# Patient Record
Sex: Male | Born: 1995 | Race: Black or African American | Hispanic: No | Marital: Single | Smoking: Never smoker
Health system: Southern US, Community
[De-identification: ages and names within clinical notes are randomized; demographics above are authoritative.]

---
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
## 2013-02-13 ENCOUNTER — Emergency Department (HOSPITAL_COMMUNITY): Payer: No Typology Code available for payment source

## 2013-02-13 ENCOUNTER — Emergency Department (HOSPITAL_COMMUNITY)
Admission: EM | Admit: 2013-02-13 | Discharge: 2013-02-13 | Disposition: A | Discharge status: Home / Self Care | Payer: No Typology Code available for payment source | Attending: Emergency Medicine | Admitting: Emergency Medicine

## 2013-02-13 ENCOUNTER — Encounter (HOSPITAL_COMMUNITY): Payer: Self-pay

## 2013-02-13 DIAGNOSIS — Y92410 Unspecified street and highway as the place of occurrence of the external cause: Secondary | ICD-10-CM | POA: Insufficient documentation

## 2013-02-13 DIAGNOSIS — S139XXA Sprain of joints and ligaments of unspecified parts of neck, initial encounter: Secondary | ICD-10-CM | POA: Insufficient documentation

## 2013-02-13 DIAGNOSIS — S161XXA Strain of muscle, fascia and tendon at neck level, initial encounter: Secondary | ICD-10-CM

## 2013-02-13 DIAGNOSIS — S7010XA Contusion of unspecified thigh, initial encounter: Secondary | ICD-10-CM | POA: Insufficient documentation

## 2013-02-13 DIAGNOSIS — S7011XA Contusion of right thigh, initial encounter: Secondary | ICD-10-CM

## 2013-02-13 DIAGNOSIS — Y9389 Activity, other specified: Secondary | ICD-10-CM | POA: Insufficient documentation
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
## 2013-02-13 MED ORDER — IBUPROFEN 800 MG PO TABS: 800.0000 mg | ORAL_TABLET | Freq: Four times a day (QID) | ORAL | Status: DC | PRN
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
## 2013-02-13 NOTE — ED Notes (Signed)
Report given to angela,, rn
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
## 2020-12-10 ENCOUNTER — Other Ambulatory Visit: Payer: Self-pay

## 2020-12-10 ENCOUNTER — Ambulatory Visit (HOSPITAL_COMMUNITY)
Admission: EM | Admit: 2020-12-10 | Discharge: 2020-12-11 | Disposition: A | Discharge status: Other Acute Inpatient Hospital | Payer: No Payment, Other | Attending: Nurse Practitioner | Admitting: Nurse Practitioner

## 2020-12-10 ENCOUNTER — Encounter (HOSPITAL_COMMUNITY): Payer: Self-pay

## 2020-12-10 DIAGNOSIS — Z880 Allergy status to penicillin: Secondary | ICD-10-CM | POA: Insufficient documentation

## 2020-12-10 DIAGNOSIS — Z20822 Contact with and (suspected) exposure to covid-19: Secondary | ICD-10-CM | POA: Insufficient documentation

## 2020-12-10 DIAGNOSIS — F1210 Cannabis abuse, uncomplicated: Secondary | ICD-10-CM | POA: Insufficient documentation

## 2020-12-10 DIAGNOSIS — F19959 Other psychoactive substance use, unspecified with psychoactive substance-induced psychotic disorder, unspecified: Secondary | ICD-10-CM | POA: Insufficient documentation
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
## 2020-12-11 ENCOUNTER — Encounter (HOSPITAL_COMMUNITY): Payer: Self-pay | Admitting: Emergency Medicine

## 2020-12-11 ENCOUNTER — Emergency Department (HOSPITAL_COMMUNITY)
Admission: EM | Admit: 2020-12-11 | Discharge: 2020-12-15 | Disposition: A | Discharge status: Home / Self Care | Attending: Emergency Medicine | Admitting: Emergency Medicine

## 2020-12-11 ENCOUNTER — Other Ambulatory Visit: Payer: Self-pay

## 2020-12-11 DIAGNOSIS — Z880 Allergy status to penicillin: Secondary | ICD-10-CM | POA: Diagnosis not present

## 2020-12-11 DIAGNOSIS — Z20822 Contact with and (suspected) exposure to covid-19: Secondary | ICD-10-CM | POA: Diagnosis not present

## 2020-12-11 DIAGNOSIS — F19959 Other psychoactive substance use, unspecified with psychoactive substance-induced psychotic disorder, unspecified: Secondary | ICD-10-CM | POA: Insufficient documentation

## 2020-12-11 DIAGNOSIS — F22 Delusional disorders: Secondary | ICD-10-CM | POA: Insufficient documentation

## 2020-12-11 DIAGNOSIS — F23 Brief psychotic disorder: Secondary | ICD-10-CM

## 2020-12-11 DIAGNOSIS — F1210 Cannabis abuse, uncomplicated: Secondary | ICD-10-CM | POA: Diagnosis not present

## 2020-12-11 LAB — SALICYLATE LEVEL: Salicylate Lvl: <7 mg/dL — ABNORMAL LOW (ref 7.0–30.0)

## 2020-12-11 LAB — CBC
HCT: 39.3 % (ref 39.0–52.0)
Hemoglobin: 13.5 g/dL (ref 13.0–17.0)
MCH: 31.1 pg (ref 26.0–34.0)
MCHC: 34.4 g/dL (ref 30.0–36.0)
MCV: 90.6 fL (ref 80.0–100.0)
Platelets: 292 10*3/uL (ref 150–400)
RBC: 4.34 MIL/uL (ref 4.22–5.81)
RDW: 12.1 % (ref 11.5–15.5)
WBC: 10.3 10*3/uL (ref 4.0–10.5)
nRBC: 0 % (ref 0.0–0.2)

## 2020-12-11 LAB — POCT URINE DRUG SCREEN - MANUAL ENTRY (I-CUP)
POC Buprenorphine (BUP): NOT DETECTED
POC Cocaine UR: NOT DETECTED
POC Marijuana UR: POSITIVE — AB
POC Morphine: NOT DETECTED
POC Oxycodone UR: NOT DETECTED
POC Secobarbital (BAR): NOT DETECTED

## 2020-12-11 LAB — COMPREHENSIVE METABOLIC PANEL
ALT: 26 U/L (ref 0–44)
AST: 66 U/L — ABNORMAL HIGH (ref 15–41)
Albumin: 4.5 g/dL (ref 3.5–5.0)
Alkaline Phosphatase: 51 U/L (ref 38–126)
Anion gap: 16 — ABNORMAL HIGH (ref 5–15)
BUN: 10 mg/dL (ref 6–20)
CO2: 19 mmol/L — ABNORMAL LOW (ref 22–32)
Calcium: 9.6 mg/dL (ref 8.9–10.3)
Chloride: 101 mmol/L (ref 98–111)
Creatinine, Ser: 1.34 mg/dL — ABNORMAL HIGH (ref 0.61–1.24)
GFR, Estimated: >60 mL/min (ref >60)
Glucose, Bld: 130 mg/dL — ABNORMAL HIGH (ref 70–99)
Potassium: 3.1 mmol/L — ABNORMAL LOW (ref 3.5–5.1)
Sodium: 136 mmol/L (ref 135–145)
Total Bilirubin: 1.1 mg/dL (ref 0.3–1.2)
Total Protein: 7.5 g/dL (ref 6.5–8.1)

## 2020-12-11 LAB — RESP PANEL BY RT-PCR (FLU A&B, COVID) ARPGX2
Influenza A by PCR: NEGATIVE
Influenza B by PCR: NEGATIVE
SARS Coronavirus 2 by RT PCR: NEGATIVE

## 2020-12-11 LAB — POCT URINE DRUG SCREEN - MANUAL ENTRY (I-SCREEN)
POC Amphetamine UR: NOT DETECTED
POC Methadone UR: NOT DETECTED
POC Methamphetamine UR: NOT DETECTED
POC Oxazepam (BZO): NOT DETECTED

## 2020-12-11 LAB — ETHANOL: Alcohol, Ethyl (B): <10 mg/dL (ref <10)

## 2020-12-11 LAB — ACETAMINOPHEN LEVEL: Acetaminophen (Tylenol), Serum: <10 ug/mL — ABNORMAL LOW (ref 10–30)

## 2020-12-11 MED ORDER — OLANZAPINE 5 MG PO TABS: 5.0000 mg | ORAL_TABLET | Freq: Every day | ORAL | Status: DC

## 2020-12-11 MED ORDER — TRAZODONE HCL 50 MG PO TABS
50.0000 mg | ORAL_TABLET | Freq: Every evening | ORAL | Status: DC | PRN
  Administered 2020-12-11: 50 mg via ORAL
  Filled 2020-12-11: qty 1

## 2020-12-11 MED ORDER — ZIPRASIDONE MESYLATE 20 MG IM SOLR
20.0000 mg | INTRAMUSCULAR | Status: AC | PRN
  Administered 2020-12-13: 20 mg via INTRAMUSCULAR

## 2020-12-11 MED ORDER — LORAZEPAM 2 MG/ML IJ SOLN: 1.0000 mg | Freq: Two times a day (BID) | INTRAMUSCULAR | Status: DC | PRN

## 2020-12-11 MED ORDER — LORAZEPAM 1 MG PO TABS
1.0000 mg | ORAL_TABLET | Freq: Once | ORAL | Status: AC
  Administered 2020-12-11: 1 mg via ORAL
  Filled 2020-12-11: qty 1

## 2020-12-11 MED ORDER — ACETAMINOPHEN 325 MG PO TABS: 650.0000 mg | ORAL_TABLET | Freq: Four times a day (QID) | ORAL | Status: DC | PRN

## 2020-12-11 MED ORDER — OLANZAPINE 10 MG PO TBDP
10.0000 mg | ORAL_TABLET | Freq: Once | ORAL | Status: AC
  Administered 2020-12-11: 10 mg via ORAL
  Filled 2020-12-11: qty 1

## 2020-12-11 MED ORDER — LORAZEPAM 1 MG PO TABS
1.0000 mg | ORAL_TABLET | Freq: Two times a day (BID) | ORAL | Status: DC | PRN
  Administered 2020-12-12 (×2): 1 mg via ORAL
  Filled 2020-12-11 (×3): qty 1

## 2020-12-11 MED ORDER — ALUM & MAG HYDROXIDE-SIMETH 200-200-20 MG/5ML PO SUSP: 30.0000 mL | ORAL | Status: DC | PRN

## 2020-12-11 MED ORDER — OLANZAPINE 5 MG PO TBDP
10.0000 mg | ORAL_TABLET | Freq: Three times a day (TID) | ORAL | Status: DC | PRN
  Administered 2020-12-12 – 2020-12-14 (×2): 10 mg via ORAL
  Filled 2020-12-11 (×3): qty 2

## 2020-12-11 MED ORDER — MAGNESIUM HYDROXIDE 400 MG/5ML PO SUSP: 30.0000 mL | Freq: Every day | ORAL | Status: DC | PRN

## 2020-12-11 MED ORDER — RISPERIDONE 1 MG PO TABS
1.0000 mg | ORAL_TABLET | Freq: Two times a day (BID) | ORAL | Status: DC
  Administered 2020-12-11 – 2020-12-12 (×2): 1 mg via ORAL
  Filled 2020-12-11 (×2): qty 1

## 2020-12-11 MED ORDER — HYDROXYZINE HCL 25 MG PO TABS
25.0000 mg | ORAL_TABLET | Freq: Three times a day (TID) | ORAL | Status: DC | PRN
  Administered 2020-12-11: 25 mg via ORAL
  Filled 2020-12-11: qty 1

## 2020-12-11 MED ORDER — TRAZODONE HCL 50 MG PO TABS
50.0000 mg | ORAL_TABLET | Freq: Every evening | ORAL | Status: DC | PRN
  Administered 2020-12-11 – 2020-12-14 (×3): 50 mg via ORAL
  Filled 2020-12-11 (×3): qty 1

## 2020-12-11 NOTE — ED Provider Notes (Addendum)
FBC/OBS ASAP Discharge Summary  Date and Time: 12/11/2020 4:45 AM  Name: Jesse Bryan  MRN:  [id number]   Discharge Diagnoses:  Final diagnoses:  Substance-induced psychotic disorder (HCC)   Jesse Bryan is a 25 y.o. male who presents to [facility] voluntarily with his mother due to paranoia. Patient's mother Rayaan Garguilo [phone]) participated in the assessment at the patient's request. Patient reports that he went to [place] for his birthday and returned this past Saturday. He states that he has not slept since returning. He reports that he started feeling paranoid while in [place]. States that he saw "some strange things on my girlfriend's social media." He feels that she is cheating on him with his best friend.  He reports that he feels people are trying to harm and his family. Patient ruminates on his girlfriend cheating on him. He makes several comments stating that he does not feel safe at home. States that he feels safe here because he knows we have security and staff. States "I can't trust anyone because no one understands what is going on." Feels that other people are able to control him through their thoughts. He denies suicidal ideations. Denies homicidal ideations. He does not appear to be responding to internal stimuli. Reports daily marijuna use. Occasionally drinks alcohol. UDS positive for marijuana, negative for other substances.   Patient denies previous psychiatric history. Patient's mother reports that to her knowledge that the patient has never experienced a psychotic episode.   Reviewed TTS assessment and validated with patient. On evaluation patient is alert and oriented x 3. Speech is clear, normal pace, normal volume. Mood is anxious and affect is congruent with mood. Thought process is disorganized and thought content is paranoid. Denies auditory and visual hallucinations. Patient does talk to himself at times, but states that he is processing his thoughts out  loud.  Patient ruminates on not feeling safe and not being able to trust others. Denies suicidal ideations. Denies homicidal ideations.   Patient was initially receptive to taking medications. However, patient spit oral medication out at nurse and provider.   Patient requires constant redirection. He continues to refuse medications. Patient attempted to climb enclosure surrounding nurse station and was straddling the top of the enclosure. Patient is not appropriate for the continuous assessment area at [facility]. Field seismologist and security have remained with patient.   This provider pettioned for IVC at magistrate office due to unavailability of notary public.   Notified patient's mother Pearlie Lafosse [phone] that patient was petitioned for IVC and transferred to [facility].     Past Medical History: History reviewed. No pertinent past medical history. History reviewed. No pertinent surgical history. Family History: History reviewed. No pertinent family history.  Social History:  Social History   Substance and Sexual Activity  Alcohol Use Yes   Comment: socially     Social History   Substance and Sexual Activity  Drug Use Yes  . Types: Marijuana   Comment: 'a lot' more than 2 blunts daily     Social History   Socioeconomic History  . Marital status: Single    Spouse name: Not on file  . Number of children: Not on file  . Years of education: Not on file  . Highest education level: Not on file  Occupational History  . Not on file  Tobacco Use  . Smoking status: Never Smoker  . Smokeless tobacco: Never Used  Vaping Use  . Vaping Use: Former  Substance and Sexual Activity  .  Alcohol use: Yes    Comment: socially  . Drug use: Yes    Types: Marijuana    Comment: 'a lot' more than 2 blunts daily   . Sexual activity: Not on file  Other Topics Concern  . Not on file  Social History Narrative  . Not on file   Social Determinants of Health   Financial Resource Strain: Not  on file  Food Insecurity: Not on file  Transportation Needs: Not on file  Physical Activity: Not on file  Stress: Not on file  Social Connections: Not on file   SDOH:  SDOH Screenings   Alcohol Screen: Not on file  Depression (XLK4-4): Not on file  Financial Resource Strain: Not on file  Food Insecurity: Not on file  Housing: Not on file  Physical Activity: Not on file  Social Connections: Not on file  Stress: Not on file  Tobacco Use: Low Risk   . Smoking Tobacco Use: Never Smoker  . Smokeless Tobacco Use: Never Used  Transportation Needs: Not on file     Current Medications:  Current Facility-Administered Medications  Medication Dose Route Frequency Provider Last Rate Last Admin  . acetaminophen (TYLENOL) tablet 650 mg  650 mg Oral Q6H PRN Nira Conn A, NP      . alum & mag hydroxide-simeth (MAALOX/MYLANTA) 200-200-20 MG/5ML suspension 30 mL  30 mL Oral Q4H PRN Nira Conn A, NP      . hydrOXYzine (ATARAX/VISTARIL) tablet 25 mg  25 mg Oral TID PRN Jackelyn Poling, NP   25 mg at 12/11/20 0203  . magnesium hydroxide (MILK OF MAGNESIA) suspension 30 mL  30 mL Oral Daily PRN Nira Conn A, NP      . OLANZapine (ZYPREXA) tablet 5 mg  5 mg Oral QHS Nira Conn A, NP      . traZODone (DESYREL) tablet 50 mg  50 mg Oral QHS PRN Nira Conn A, NP   50 mg at 12/11/20 0203   No current outpatient medications on file.    PTA Medications: (Not in a hospital admission)    Psychiatric Specialty Exam  Presentation  General Appearance: Appropriate for Environment; Neat  Eye Contact:Fair  Speech:Normal Rate  Speech Volume:Normal  Handedness:Right   Mood and Affect  Mood:Anxious  Affect:Congruent   Thought Process  Thought Processes:Disorganized  Descriptions of Associations:Circumstantial  Orientation:Full (Time, Place and Person)  Thought Content:Paranoid Ideation  Hallucinations:Hallucinations: None  Ideas of Reference:Paranoia  Suicidal  Thoughts:Suicidal Thoughts: No  Homicidal Thoughts:Homicidal Thoughts: No   Sensorium  Memory:Immediate Fair; Recent Fair; Remote Fair  Judgment:Impaired  Insight:Lacking   Executive Functions  Concentration:Poor  Attention Span:Poor  Recall:Fair  Fund of Knowledge:Fair  Language:Good   Psychomotor Activity  Psychomotor Activity:Psychomotor Activity: Restlessness   Assets  Assets:Desire for Improvement; Housing; Physical Health; Transportation; Social Support; Talents/Skills   Sleep  Sleep:Sleep: Poor   Physical Exam  Physical Exam Constitutional:      General: He is not in acute distress.    Appearance: He is not ill-appearing, toxic-appearing or diaphoretic.  HENT:     Head: Normocephalic.     Right Ear: External ear normal.     Left Ear: External ear normal.  Eyes:     Pupils: Pupils are equal, round, and reactive to light.  Cardiovascular:     Rate and Rhythm: Normal rate.  Pulmonary:     Effort: Pulmonary effort is normal. No respiratory distress.  Musculoskeletal:        General: Normal range of  motion.  Skin:    General: Skin is warm and dry.  Neurological:     Mental Status: He is alert.  Psychiatric:        Mood and Affect: Mood is anxious. Mood is not depressed.        Speech: Speech normal.        Behavior: Behavior is uncooperative.        Thought Content: Thought content is paranoid and delusional. Thought content does not include homicidal or suicidal ideation. Thought content does not include suicidal plan.    Review of Systems  Psychiatric/Behavioral: Positive for substance abuse. Negative for depression, memory loss and suicidal ideas. The patient is nervous/anxious and has insomnia.    Blood pressure (!) 148/68, pulse (!) 120, temperature 98 F (36.7 C), temperature source Temporal, resp. rate 16, SpO2 97 %. There is no height or weight on file to calculate BMI.   Disposition: Recommend inpatient treatment after medical  clearance.   Jackelyn Poling, NP 12/11/2020, 4:45 AM

## 2020-12-11 NOTE — ED Notes (Signed)
Pt was pushing on nursing station door. He jumped on the counter and tried to jump over the glass to get behind the nurses station.

## 2020-12-11 NOTE — ED Notes (Signed)
Pt spit his medications out after it was scanned

## 2020-12-11 NOTE — Progress Notes (Signed)
Called to attempt assessment. Informed patient is still in waiting room, and there are no available rooms for an assessment currently.

## 2020-12-11 NOTE — ED Notes (Addendum)
Pt refused blood work due to paranoia. NP aware of blood work refusal

## 2020-12-11 NOTE — ED Triage Notes (Signed)
Patient from [facility], patient has been IVC'd.  Patient with paranoia, feels that people are trying to harm him.  He refusing medications.  Patient came via [employer].

## 2020-12-11 NOTE — ED Notes (Signed)
Called report to [place] ed charge nurse [place] and gave report on pt coming over to them due to patient paranoia and his level of care that is needed

## 2020-12-11 NOTE — Progress Notes (Signed)
Pt meets inpatient criteria per Nira Conn, NP. Referral information has been sent to the following hospitals for review:  [facility]  [facility]  [facility]  [facility]  [facility]  [facility]  [facility]  [facility]  [facility]  [facility]  [facility]  [facility]  [facility]  [facility]  [facility]  [facility]     Disposition will continue to follow.    Wells Guiles, MSW, LCSW, LCAS Clinical Social Worker II Disposition CSW [phone]

## 2020-12-11 NOTE — ED Notes (Signed)
Waiting on IVC paperwork
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
## 2020-12-11 NOTE — Discharge Instructions (Addendum)
Make sure you follow-up at the behavioral health urgent care for further care and treatment. Call them to schedule an appointment.

## 2020-12-11 NOTE — ED Notes (Signed)
Patient escorted to room 49 by EDRN and behavioral sitter. Pleasant, informed patient needing to change from street clothes to maroon scrubs. TTS placed at bedside as well.

## 2020-12-11 NOTE — Discharge Instructions (Addendum)
Transfer to ED
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
## 2020-12-11 NOTE — ED Provider Notes (Signed)
Behavioral Health Admission H&P [facility])  Date: 12/11/20 Patient Name: Jesse Bryan MRN: [id number] Chief Complaint:  Chief Complaint  Patient presents with  . Paranoid   Chief Complaint/Presenting Problem: Jesse Bryan is a 25yo male presenting as a walk in to [facility] for assessment of paranoia/delusional behavior that started after a recent trip to [place] to celebrate pts birthday. Pt reports that he went to a dispensary and purchased some weed and has not slept in three days. Pt is a poor historian and often rambles when asked open-ended questions. Pt admits that he has been very paranoid and feels that people are watching him and plotting to hurt him. Pt feels that people are watching him through his cell phone camera. Pt also feels that he can read other people's thoughts and that other people can read his thoughts and even control him through his thoughts. Pt denies any SI, HI, or AVH.  Pt admits that he has been using substances today--pt smoked marijuana with a friend and took at least one shot of alcohol. Pt presents with very paranoid ideation and disorganized thought process. Pts mother reports that Jesse Bryan's behavior is way out of the norm. Pt is not receiving any psychiatric services at any point in his life. Pts mother reports that pts uncle has struggled with schizophrenia, and pts brother has paranoia and hears voices.  Diagnoses:  Final diagnoses:  Substance-induced psychotic disorder [facility]    HPI: Jesse Bryan is a 25 y.o. male who presents to [facility] voluntarily with his mother due to paranoia. Patient reports that he went to [place] for his birthday and returned this past Saturday. He states that he has not slept since returning. He reports that he started feeling paranoid while in [place]. States that he saw "some strange things on my girlfriend's social media." He feels that she is cheating on him with his best friend.  He reports that he feels people are trying  to harm and his family. Patient ruminates on his girlfriend cheating on him. He makes several comments stating that he does not feel safe at home. States that he feels safe here because he knows we have security and staff. States "I can't trust anyone because no one understands what is going on." Feels that other people are able to control him through their thoughts. He denies suicidal ideations. Denies homicidal ideations. He does not appear to be responding to internal stimuli. Reports daily marijuna use. Occasionally drinks alcohol. UDS positive for marijuana, negative for other substances.   Reviewed TTS assessment and validated with patient. On evaluation patient is alert and oriented x 3. Speech is clear, normal pace, normal volume. Mood is anxious and affect is congruent with mood. Thought process is disorganized and thought content is paranoid. Denies auditory and visual hallucinations. Patient does talk to himself at times, but states that he is processing his thoughts out loud.  Patient ruminates on not feeling safe and not being able to trust others. Denies suicidal ideations. Denies homicidal ideations.   Patient was initially receptive to taking medications. However, patient spit oral medication out at nurse and provider.   Patient requires constant redirection. He continues to refuse medications. Patient attempted to climb enclosure surrounding nurse station and was straddling the top of the enclosure. Patient is not appropriate for the continuous assessment area at [facility]. Field seismologist and security have remained with patient.   This provider pettioned for IVC at magistrate office due to unavailability of notary public.  PHQ 2-9:     Total Time spent with patient: 30 minutes  Musculoskeletal  Strength & Muscle Tone: within normal limits Gait & Station: normal Patient leans: N/A  Psychiatric Specialty Exam  Presentation General Appearance: Appropriate for Environment; Neat  Eye  Contact:Fair  Speech:Normal Rate  Speech Volume:Normal  Handedness:Right   Mood and Affect  Mood:Anxious  Affect:Congruent   Thought Process  Thought Processes:Disorganized  Descriptions of Associations:Circumstantial  Orientation:Full (Time, Place and Person)  Thought Content:Paranoid Ideation  Hallucinations:Hallucinations: None  Ideas of Reference:Paranoia  Suicidal Thoughts:Suicidal Thoughts: No  Homicidal Thoughts:Homicidal Thoughts: No   Sensorium  Memory:Immediate Fair; Recent Fair; Remote Fair  Judgment:Impaired  Insight:Lacking   Executive Functions  Concentration:Poor  Attention Span:Poor  Recall:Fair  Fund of Knowledge:Fair  Language:Good   Psychomotor Activity  Psychomotor Activity:Psychomotor Activity: Restlessness   Assets  Assets:Desire for Improvement; Housing; Physical Health; Transportation; Social Support; Talents/Skills   Sleep  Sleep:Sleep: Poor   Physical Exam Constitutional:      General: He is not in acute distress.    Appearance: He is not ill-appearing, toxic-appearing or diaphoretic.  HENT:     Head: Normocephalic.     Right Ear: External ear normal.     Left Ear: External ear normal.  Eyes:     Conjunctiva/sclera: Conjunctivae normal.     Pupils: Pupils are equal, round, and reactive to light.  Cardiovascular:     Rate and Rhythm: Normal rate.  Pulmonary:     Effort: Pulmonary effort is normal. No respiratory distress.  Musculoskeletal:        General: Normal range of motion.  Neurological:     Mental Status: He is alert and oriented to person, place, and time.  Psychiatric:        Mood and Affect: Mood is anxious.        Speech: Speech normal.        Behavior: Behavior is cooperative.        Thought Content: Thought content is paranoid. Thought content does not include homicidal or suicidal ideation. Thought content does not include suicidal plan.    Review of Systems  Constitutional: Negative for  chills, diaphoresis, fever, malaise/fatigue and weight loss.  HENT: Negative for congestion.   Respiratory: Negative for cough and shortness of breath.   Cardiovascular: Negative for chest pain and palpitations.  Gastrointestinal: Negative for diarrhea, nausea and vomiting.  Neurological: Negative for dizziness and seizures.  Psychiatric/Behavioral: Positive for substance abuse (marijuana). Negative for depression, hallucinations, memory loss and suicidal ideas. The patient is nervous/anxious and has insomnia.   All other systems reviewed and are negative.   Blood pressure (!) 148/68, pulse (!) 120, temperature 98 F (36.7 C), temperature source Temporal, resp. rate 16, SpO2 97 %. There is no height or weight on file to calculate BMI.  Past Psychiatric History: Denies previous psychiatric history   Is the patient at risk to self? Yes  Has the patient been a risk to self in the past 6 months? No .    Has the patient been a risk to self within the distant past? No   Is the patient a risk to others? No   Has the patient been a risk to others in the past 6 months? No   Has the patient been a risk to others within the distant past? No   Past Medical History: History reviewed. No pertinent past medical history. History reviewed. No pertinent surgical history.  Family History: History reviewed. No pertinent family history.  Social History:  Social History   Socioeconomic History  . Marital status: Single    Spouse name: Not on file  . Number of children: Not on file  . Years of education: Not on file  . Highest education level: Not on file  Occupational History  . Not on file  Tobacco Use  . Smoking status: Never Smoker  . Smokeless tobacco: Never Used  Vaping Use  . Vaping Use: Former  Substance and Sexual Activity  . Alcohol use: Yes    Comment: socially  . Drug use: Yes    Types: Marijuana    Comment: 'a lot' more than 2 blunts daily   . Sexual activity: Not on file   Other Topics Concern  . Not on file  Social History Narrative  . Not on file   Social Determinants of Health   Financial Resource Strain: Not on file  Food Insecurity: Not on file  Transportation Needs: Not on file  Physical Activity: Not on file  Stress: Not on file  Social Connections: Not on file  Intimate Partner Violence: Not on file    SDOH:  SDOH Screenings   Alcohol Screen: Not on file  Depression (WPY0-9): Not on file  Financial Resource Strain: Not on file  Food Insecurity: Not on file  Housing: Not on file  Physical Activity: Not on file  Social Connections: Not on file  Stress: Not on file  Tobacco Use: Low Risk   . Smoking Tobacco Use: Never Smoker  . Smokeless Tobacco Use: Never Used  Transportation Needs: Not on file    Last Labs:  No visits with results within 6 Month(s) from this visit.  Latest known visit with results is:  No results found for any previous visit.    Allergies: Penicillins  PTA Medications: (Not in a hospital admission)   Medical Decision Making  Admission orders placed  Zyprexa 10 mg x 1 dose now for substance induced paranoia/anxiety Ativan 1 mg PO x 1 dose for anxiety  Start zyprexa 5 mg QHS for substance induced paranoia  Patient refused labs other than UDS. Continues to refuse medications.      Recommendations  Based on my evaluation the patient does not appear to have an emergency medical condition.  Recommend inpatient treatment after medical clearance.   Jackelyn Poling, NP 12/11/20  12:57 AM

## 2020-12-11 NOTE — ED Notes (Signed)
Pt refused medications, blood work due to his paranoia believing we are poisoning him. Writer let him read the medication package and the juice package. He then said he would only take the mediation from me while his mom and NP was present. I put the pills in his mouth for him, he then preceded to spit them out. He refused a shot that was offered by NP. Security assisted with escorting him back on the unit. Pt is on unit speaking with security about his Anne Hahn. Will continue to monitor for safety. Np going to magistrate office to IVC patinet

## 2020-12-11 NOTE — ED Notes (Signed)
PT WAS PUT IN HANDCUFFS HE WAS RESISTING ARREST HE HAD TO BE RESTRAINED BY 5 PEOPLE TWO [employer], 1 [place] [employer] AND TWO [facility] SHERIFF DEPARTMENT. HE IS BEING TRANSFERRED TO [facility] [place] BY [employer]

## 2020-12-11 NOTE — ED Provider Notes (Signed)
[facility] EMERGENCY DEPARTMENT Provider Note   CSN: [id number] Arrival date & time: 12/11/20  2130     History Chief Complaint  Patient presents with  . Paranoid    Jesse Bryan is a 25 y.o. male without significant PMHx, presenting to the ED from [facility] under IVC awaiting inpatient psych placement at [facility]. Patient presented to [facility] for paranoia that began following a trip to [place] for his birthday.  While in [place] he used marijuana that he obtained from the local legal dispensary's. He states when he began smoking he "realized people's true intentions."  reports "people watching me"  girlfriend was sending inappropriate messages to other people think she is being unfaithful.  Per chart review, patient reportedly was refusing medications and therefore placed under IVC and sent to the ED to await inpatient placement.  He denies any daily medication use.  Is denied any other illicit drug or alcohol use.  The history is provided by the patient and medical records.       History reviewed. No pertinent past medical history.  Patient Active Problem List   Diagnosis Date Noted  . Psychoactive substance-induced psychosis (HCC)     History reviewed. No pertinent surgical history.     No family history on file.  Social History   Tobacco Use  . Smoking status: Never Smoker  . Smokeless tobacco: Never Used  Vaping Use  . Vaping Use: Former  Substance Use Topics  . Alcohol use: Yes    Comment: socially  . Drug use: Yes    Types: Marijuana    Comment: 'a lot' more than 2 blunts daily     Home Medications Prior to Admission medications   Not on File    Allergies    Penicillins  Review of Systems   Review of Systems  Psychiatric/Behavioral: Positive for sleep disturbance. Negative for hallucinations and suicidal ideas.       Paranoia  All other systems reviewed and are negative.   Physical Exam Updated Vital Signs BP 112/76 (BP  Location: Right Arm)   Pulse 91   Temp 98.1 F (36.7 C) (Oral)   Resp 16   SpO2 100%   Physical Exam Vitals and nursing note reviewed.  Constitutional:      General: He is not in acute distress.    Appearance: He is well-developed and well-nourished. He is not ill-appearing.  HENT:     Head: Normocephalic and atraumatic.  Eyes:     Conjunctiva/sclera: Conjunctivae normal.  Cardiovascular:     Rate and Rhythm: Normal rate and regular rhythm.  Pulmonary:     Effort: Pulmonary effort is normal. No respiratory distress.     Breath sounds: Normal breath sounds.  Abdominal:     Palpations: Abdomen is soft.  Skin:    General: Skin is warm.  Neurological:     Mental Status: He is alert.  Psychiatric:        Mood and Affect: Mood and affect and mood normal.        Behavior: Behavior normal.        Thought Content: Thought content does not include homicidal or suicidal ideation.     ED Results / Procedures / Treatments   Labs (all labs ordered are listed, but only abnormal results are displayed) Labs Reviewed  COMPREHENSIVE METABOLIC PANEL - Abnormal; Notable for the following components:      Result Value   Potassium 3.1 (*)    CO2 19 (*)  Glucose, Bld 130 (*)    Creatinine, Ser 1.34 (*)    AST 66 (*)    Anion gap 16 (*)    All other components within normal limits  SALICYLATE LEVEL - Abnormal; Notable for the following components:   Salicylate Lvl <7.0 (*)    All other components within normal limits  ACETAMINOPHEN LEVEL - Abnormal; Notable for the following components:   Acetaminophen (Tylenol), Serum <10 (*)    All other components within normal limits  ETHANOL  CBC    EKG None  Radiology No results found.  Procedures Procedures   Medications Ordered in ED Medications  OLANZapine zydis (ZYPREXA) disintegrating tablet 10 mg (has no administration in time range)    And  ziprasidone (GEODON) injection 20 mg (has no administration in time range)   LORazepam (ATIVAN) tablet 1 mg (has no administration in time range)    Or  LORazepam (ATIVAN) injection 1 mg (has no administration in time range)    ED Course  I have reviewed the triage vital signs and the nursing notes.  Pertinent labs & imaging results that were available during my care of the patient were reviewed by me and considered in my medical decision making (see chart for details).    MDM Rules/Calculators/A&P                          Patient is medically cleared for inpatient psychiatric placement.  COVID swab was done at [facility] and is negative.  UDS was also positive for cannabinoids. Labs here are unremarkable.  Patient presented under IVC, first examination completed. Final Clinical Impression(s) / ED Diagnoses Final diagnoses:  Paranoia (HCC)  Delusions [facility])    Rx / DC Orders ED Discharge Orders    None       Briseis Aguilera, [place] N, PA-C 12/11/20 1753    Arby Barrette, MD 12/11/20 2130

## 2020-12-12 MED ORDER — RISPERIDONE 3 MG PO TABS
3.0000 mg | ORAL_TABLET | Freq: Every day | ORAL | Status: DC
  Administered 2020-12-12 – 2020-12-14 (×3): 3 mg via ORAL
  Filled 2020-12-12 (×3): qty 1

## 2020-12-12 MED ORDER — RISPERIDONE 1 MG PO TABS
2.0000 mg | ORAL_TABLET | Freq: Every morning | ORAL | Status: DC
  Administered 2020-12-14 – 2020-12-15 (×2): 2 mg via ORAL
  Filled 2020-12-12 (×4): qty 2

## 2020-12-12 NOTE — ED Provider Notes (Signed)
Emergency Medicine Observation Re-evaluation Note  Jesse Bryan is a 25 y.o. male, seen on rounds today.  Pt initially presented to the ED for complaints of Paranoid Currently, the patient is awaiting behavioral health placement.  Physical Exam  BP (!) 151/90 (BP Location: Right Arm)   Pulse (!) 106   Temp 98.2 F (36.8 C) (Axillary)   Resp 16   Ht 5\' 5"  (1.651 m)   Wt 63.5 kg   SpO2 98%   BMI 23.30 kg/m  Physical Exam General: Awake alert well-appearing nontoxic no acute distress Skin: Warm dry Lungs: Regular unlabored Psych: Pleasant cooperative  ED Course / MDM  EKG:    I have reviewed the labs performed to date as well as medications administered while in observation.  Recent changes in the last 24 hours. Labs reviewed, patient mildly hypokalemic earlier with slight elevation of creatinine.  Suspect this to be secondary to dehydration on arrival patient now tolerating p.o., no indication for recheck.  Plan   11:20 AM: Patient reassessed, he is resting comfortably in bed no acute distress, was asleep easily arousable to voice.  Reports that he is feeling well, he enjoyed breakfast.  He has no concerns or complaints at this time.  Current plan is for psychiatric placement.   Note: Portions of this report may have been transcribed using voice recognition software. Every effort was made to ensure accuracy; however, inadvertent computerized transcription errors may still be present.   12/12/20 1145    12/14/20, MD 12/15/20 [phone]

## 2020-12-12 NOTE — Progress Notes (Signed)
[facility] called to inquire about whether Pt stills needs placement.    CSW made them aware that Pt is still awaiting placement.     Disposition CSW will continue to assist in seeking appropriate bed placement.     Jacinta Shoe, LCSW Clinical Social Worker - Disposition  [phone]

## 2020-12-12 NOTE — Progress Notes (Signed)
Pt continues to meet criteria for inpatient treatment.   CSW submitted to the following facility for review:  [facility]   CSW resubmitted fax referral to the following facilities:  [facility]    Disposition CSW will continue to assist in seeking appropriate bed placement.    Jacinta Shoe, LCSW  Clinical Social Worker - Disposition  [phone]

## 2020-12-12 NOTE — ED Notes (Signed)
Mother --- Alyce Pagan --- [phone]

## 2020-12-12 NOTE — ED Notes (Signed)
Timothy Lasso Deveney -- [phone]
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
## 2020-12-13 NOTE — ED Notes (Signed)
Patient stated that he was ready to take his medicine. 2mg  risperdal given, patient put the tablets into his mouth and then walked quietly to the sink and spit them out. Patient denied spitting out the medication initially even when shown the two tablets sitting in the sink basin. Patient eventually admitted to spitting out the medication and stated that he does not need the medicine because his thoughts are clear and he is ready to leave.

## 2020-12-13 NOTE — ED Provider Notes (Signed)
Emergency Medicine Observation Re-evaluation Note  Jesse Bryan is a 25 y.o. male, seen on rounds today.  Pt initially presented to the ED for complaints of Paranoid Currently, the patient is resting comfortably.  Physical Exam  BP 140/85 (BP Location: Right Arm)   Pulse 87   Temp 98 F (36.7 C) (Oral)   Resp 18   Ht 5\' 5"  (1.651 m)   Wt 63.5 kg   SpO2 100%   BMI 23.30 kg/m  Physical Exam General: Sleeping in bed, answers to name Skin: warm and dry Lungs: no respiratory distress Psych: currently calm and resting  ED Course / MDM  EKG:    I have reviewed the labs performed to date as well as medications administered while in observation. Plan  Current plan is for psychiatric placement.   , Rozelle Logan 12/13/20 12/15/20

## 2020-12-13 NOTE — ED Notes (Signed)
This RN and [place], Charity fundraiser, attempted to give pt 1mg  PO ativan for increasing anxiety that has not decreased with attempts at verbal de-escalation. Pt spat PO ativan into sink.

## 2020-12-13 NOTE — ED Notes (Signed)
20mg  Geodon given IM per pt pacing restlessly, not listening to staff, and becoming increasingly agitated & inconsolable.

## 2020-12-13 NOTE — ED Notes (Signed)
Patient called mother.

## 2020-12-13 NOTE — BH Assessment (Addendum)
Reassessment 12/13/20:   Patient is  25 y/o male that presented to the [facility] on 12/10/20. He was brought to the [facility] by his brother and sister in law.  States that they wanted him to come in for an assessment because, "They think I'm crazy" and "I am not crazy it's the weed that I smoke".  He denies a prior mental health diagnosis. No history of INPT psychiatric treatment or Outpatient psychiatric treatment.   He was assessed by TTS and referred to [facility] due to refusal of labs. Patient has since remained in the ED at [facility]   Upon chart review: Upon arrival on 12/10/20: "Patient reports not eating or sleeping for up to 48 hours. Patient unaware of date". States that he has been taking his medication in the Emergency Department. However, he wants the nurse to explain to him what the medications are for prior to him taking them. Upon chart review from 12/13/20: "Patient stated that he was ready to take his medicine. 2mg  risperdal given, patient put the tablets into his mouth and then walked quietly to the sink and spit them out. Patient denied spitting out the medication initially even when shown the two tablets sitting in the sink basin. Patient eventually admitted to spitting out the medication and stated that he does not need the medicine because his thoughts are clear and he is ready to leave".  Today, patient denies suicidal ideations. Denies history of suicide attempts and/gestures. Denies thoughts to harm others. Denies AVH's. He does acknowledge feelings of paranoia. He is hyper focused on his girlfriend. Upon chart review on 12/10/20: "Per sister in law, family just returned on Sat from Tue. Girlfriend reported something happened while gone and he's been acting different, accusing her of cheating, saying people are out to get him, paranoid & fidgety". He continues to appear paranoid but no fidgety. He is calm and cooperative in today's assessment.    Patient started smoking THC at the age of 25 y/o.  He  reports use of THC 3-4 times since the age of 25 yrs old. His last use of THC was 12/09/20. He also drinks alcohol on the weekends. He will take up to 5 shots of alcohol per day during the weekend days.   States that he currently lives with girlfriend. However, doesn't want to live with her anymore. Upon discharge he plans to move in with close friends. 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   "I feel like my girlfriend set me up and that's why I'm here". States that he is only paranoid around his girlfriend only. He doesn't feel paranoid today because he is away from his girlfriend.   Disposition: Per Dr. 12/11/20 and Lucianne Muss, NP, patient continues to meet criteria for INPT treatment. Disposition LCSW to seek appropriate placement.

## 2020-12-13 NOTE — ED Notes (Signed)
Pt continuing to step out of room despite being told multiple times to remain within his room and not wander around purple zone

## 2020-12-13 NOTE — BH Assessment (Signed)
Requested TTS machine to be placed in patient's room for his re-assessment.

## 2020-12-13 NOTE — ED Notes (Signed)
Pt given cup of ice per request, stating it will help with his anxiety

## 2020-12-14 NOTE — Progress Notes (Addendum)
Pt continues to meet inpatient criteria. Referral information has been re-faxed to the following hospitals for review:  [facility]     [facility]  [facility]  [facility]  [facility]  [facility]  [facility]  [facility]  [facility]  [facility]  [facility]  [facility]  [facility]  [facility]  [facility]  [facility]  [facility]        Disposition will continue to follow.    Wells Guiles, MSW, LCSW, LCAS Clinical Social Worker II Disposition CSW [phone]

## 2020-12-14 NOTE — Progress Notes (Signed)
Jesse Bryan is requesting a copy of pt's labs and IVC. [facility] RN to fax IVC to [phone]. CSW will fax labs.    Wells Guiles, MSW, LCSW, LCAS Clinical Social Worker II Disposition CSW [phone]

## 2020-12-14 NOTE — BH Assessment (Signed)
Reassessment Note: Pt presents sitting on side of his bed dressed in scrubs. He is alert and easily engages in conversation. Thoughts are well-organized and goal-directed. Pt denies SI, HI and AVH. He is eager for discharge. He states he will likely stay with his mother when he leaves the hospital. He says he thinks she wants that and "her energy is good". Pt states he is feeling better since admission with his mind and body working together. He notes prior to admission he was feeling like things were coming to an end and social media was stressing him.  Pt states he will participate in outpt tx following discharge. He was concerned about cost- pt was given information about [facility] assessment, counseling and med mngt. At this time, inpt tx is recommended with re-evaluation tomorrow.

## 2020-12-14 NOTE — ED Notes (Signed)
Saltine crackers and peanut butter given to pt

## 2020-12-14 NOTE — ED Notes (Signed)
Standing in the doorway taking to sitter

## 2020-12-14 NOTE — ED Notes (Signed)
Spoke to mother and update given at patient request.

## 2020-12-15 NOTE — ED Notes (Signed)
PT states his camo coat and shoes are missing. Rn has looked everywhere and do not see them all over department.

## 2020-12-15 NOTE — ED Notes (Signed)
Pt sitting at the edge of his room in a chair. No complaints. Calm and cooperative.

## 2020-12-15 NOTE — ED Provider Notes (Signed)
Emergency Medicine Observation Re-evaluation Note  Jesse Bryan is a 25 y.o. male, seen on rounds today.  Pt initially presented to the ED for complaints of Paranoid Currently, the patient is alert and comfortable.  Physical Exam  BP 122/74 (BP Location: Right Arm)   Pulse 73   Temp 98.4 F (36.9 C) (Oral)   Resp 20   Ht 5\' 5"  (1.651 m)   Wt 63.5 kg   SpO2 99%   BMI 23.30 kg/m  Physical Exam General: Nontoxic Cardiac: Normal heart rate Lungs: No respiratory distress Psych: Not responding to internal stimuli  ED Course / MDM  EKG:    I have reviewed the labs performed to date as well as medications administered while in observation.  Recent changes in the last 24 hours include has remained stable and comfortable.  Plan  Current plan is for discharge home. To follow-up at the behavioral health urgent care. I rescinded his IVC. Patient is not under full IVC at this time.   , MD 12/15/20 [phone]

## 2020-12-15 NOTE — Care Management (Signed)
Per Marlinda Mike, NP - patient will be psychiatrically cleared.  Writer informed the ER RN.  ER RN reports that she will inform the ER MD of the disposition.   ER RN reports that Clinical research associate did not need to fax any resources.

## 2020-12-15 NOTE — BHH Suicide Risk Assessment (Cosign Needed Addendum)
Suicide Risk Assessment  Discharge Assessment   [facility] Discharge Suicide Risk Assessment   Principal Problem: Acute psychosis [facility]) Discharge Diagnoses: Principal Problem:   Acute psychosis [facility])  December 15, 2020:   Jesse Bryan, 25 y.o., male patient who was admitted to [facility] 12/11/2020 for evaluation for paranoia, historically onset was after consuming medical marijuana. Patient seen via telepsych by this provider; chart reviewed and consulted with Dr. Lucianne Muss on 12/15/20.  On evaluation Jesse Bryan reports, "I was in a bad place when I came in because I didn't sleep in 2 days."  Admission UDS was positive for THC, non-fasting blood sugar levels were slightly elevated, BAL was negative.    Patient was medically cleared and primarily received services from psychiatry.  He was started on 3  psychotropic medications to include prn medications for agitation. Has been compliant and tolerated the medications without side effects.  Sleep and appetite are both good.  Since starting medications, he is clear and coherent, calm and cooperative,  and demonstrates symptomatic improvement.  Nursing notes over the past 24 hours collaborate this.  He denies suicidal or homicidal ideations and denies audible or visual hallucinations.  He does not have a history for mental illness, self injurious behaviors or psychiatric inpatient admission.    Patient lives with his girlfriend but since he suspects infidelity, states if discharged today he will go to a family member's house for a few days and seek additional long term housing from there.  He appears level headed no anger or irritation seen when conveying his plans for the future.    Per EDP Admission Notes 12/11/2020 Jesse Bryan is a 25 y.o. male without significant Bryan, Jesse Bryan. Patient presented to [facility] for paranoia that began following a trip to  [place] for his birthday.  While in [place] he used marijuana that he obtained from the local legal dispensary's. He states when he began smoking he "realized people's true intentions."reports "people watching me"  girlfriend was sending inappropriate messages to other people think she is being unfaithful. Per chart review, patient reportedly was refusing medications and therefore placed under IVC and sent to the ED to await inpatient placement.  He denies any daily medication use.  Is denied any other illicit drug or alcohol use. Total Time spent with patient: 30 minutes  Musculoskeletal: Strength & Muscle Tone: within normal limits Gait & Station: normal Patient leans: N/A  Psychiatric Specialty Exam: @ROSBYAGE @  Blood pressure 107/65, pulse 62, temperature 99 F (37.2 C), temperature source Oral, resp. rate 14, height 5\' 5"  (1.651 m), weight 63.5 kg, SpO2 97 %.Body mass index is 23.3 kg/m.  General Appearance: Casual and Fairly Groomed  ::  Good  Speech:  Clear and Coherent and Normal Rate  Volume:  Normal  Mood:  Euthymic  Affect:  Appropriate and Congruent  Thought Process:  Coherent, Goal Directed and Descriptions of Associations: Intact  Orientation:  Full (Time, Place, and Person)  Thought Content:  Logical and improved since admission  Suicidal Thoughts:  No  Homicidal Thoughts:  No  Memory:  Immediate;   Good Recent;   Good Remote;   Good  Judgement:  Good, improved since admission  Insight:  Fair  Psychomotor Activity:  Normal  Concentration:  Good  Recall:  Good  Fund of Knowledge:Good  Language: Good  Akathisia:  Negative  Handed:  Right  AIMS (if  indicated):     Assets:  Communication Skills Desire for Improvement Housing Social Support  Sleep:     Cognition: WNL  ADL's:  Intact   Mental Status Per Nursing Assessment::   On Admission:     Demographic Factors:  Male  Loss Factors: NA  Historical Factors: NA  Risk Reduction  Factors:   Sense of responsibility to family and Living with another person, especially a relative  Continued Clinical Symptoms:  Alcohol/Substance Abuse/Dependencies  Cognitive Features That Contribute To Risk:  None    Suicide Risk:  Minimal: No identifiable suicidal ideation.  Patients Jesse with no risk factors but with morbid ruminations; may be classified as minimal risk based on the severity of the depressive symptoms   Plan Of Care/Follow-up recommendations:  Plan- As per above assessment,  there are no current grounds for involuntary commitment at this time.?  Patient is not currently interested in inpatient services, but expresses agreement to continue outpatient treatment - we have reviewed importance of substance abuse abstinence, potential negative impact substance abuse can have on his relationships and level of functioning.  Also given outpatient resources for counseling.    Disposition: No evidence of imminent risk to self or others at present.   Patient does not meet criteria for psychiatric inpatient admission. Supportive therapy provided about ongoing stressors. Discussed crisis plan, support from social network, calling 911, coming to the Emergency Department, and calling Suicide Hotline.   This Clinical research associate was given EDP contact # from [employer] charge nurse but unable to reach anyone at this time.  Devonne Doughty, LCSW shared above information with treating RN as well as recommendations for IVC to be rescinded. ?   Ava Elisabeth Most, dispositions SW [facility] was also notified of above recommendations and patient disposition.?  This service was provided via telemedicine using a 2-way, interactive audio and video technology.  Patient location: [facility] ED Provider location: virtual home office  Names of all persons participating in this telemedicine service and their role in this encounter. Name: Jesse Bryan Role: Patient  Name: Ophelia Shoulder Role: PMHNP     Chales Abrahams, NP 12/15/2020, 9:52 AM

## 2020-12-15 NOTE — ED Notes (Signed)
Pt out to desk asking if he is safe here in this area. Assured pt that he is very safe here. Pt calm and cooperative.

## 2020-12-15 NOTE — ED Notes (Signed)
PT came out and says he feels good. He contacted his mother. He asked when he may be reassessed. RN advised unsure of time. He states he is appreciative and feeling ready to go. He is back in room and very pleasant and redirectable at this time.

## 2020-12-30 ENCOUNTER — Ambulatory Visit (HOSPITAL_COMMUNITY)
Admission: EM | Admit: 2020-12-30 | Discharge: 2020-12-31 | Disposition: A | Discharge status: Other Acute Inpatient Hospital | Payer: No Payment, Other | Attending: Psychiatry | Admitting: Psychiatry

## 2020-12-30 ENCOUNTER — Other Ambulatory Visit: Payer: Self-pay

## 2020-12-30 ENCOUNTER — Encounter (HOSPITAL_COMMUNITY): Payer: Self-pay

## 2020-12-30 DIAGNOSIS — Z20822 Contact with and (suspected) exposure to covid-19: Secondary | ICD-10-CM | POA: Insufficient documentation

## 2020-12-30 DIAGNOSIS — F1290 Cannabis use, unspecified, uncomplicated: Secondary | ICD-10-CM | POA: Insufficient documentation

## 2020-12-30 DIAGNOSIS — Z79899 Other long term (current) drug therapy: Secondary | ICD-10-CM | POA: Insufficient documentation

## 2020-12-30 DIAGNOSIS — F419 Anxiety disorder, unspecified: Secondary | ICD-10-CM | POA: Diagnosis not present

## 2020-12-30 DIAGNOSIS — F250 Schizoaffective disorder, bipolar type: Secondary | ICD-10-CM | POA: Diagnosis not present

## 2020-12-30 DIAGNOSIS — F19959 Other psychoactive substance use, unspecified with psychoactive substance-induced psychotic disorder, unspecified: Secondary | ICD-10-CM | POA: Insufficient documentation

## 2020-12-30 DIAGNOSIS — F22 Delusional disorders: Secondary | ICD-10-CM | POA: Insufficient documentation

## 2020-12-30 DIAGNOSIS — R450 Nervousness: Secondary | ICD-10-CM | POA: Insufficient documentation

## 2020-12-30 MED ORDER — TRAZODONE HCL 50 MG PO TABS
50.0000 mg | ORAL_TABLET | Freq: Every evening | ORAL | Status: DC | PRN
  Administered 2020-12-31: 50 mg via ORAL
  Filled 2020-12-30: qty 1

## 2020-12-30 MED ORDER — HYDROXYZINE HCL 25 MG PO TABS: 25.0000 mg | ORAL_TABLET | Freq: Three times a day (TID) | ORAL | Status: DC | PRN

## 2020-12-30 MED ORDER — QUETIAPINE FUMARATE 50 MG PO TABS
50.0000 mg | ORAL_TABLET | Freq: Every day | ORAL | Status: DC
  Administered 2020-12-31: 50 mg via ORAL
  Filled 2020-12-30: qty 1

## 2020-12-30 MED ORDER — MAGNESIUM HYDROXIDE 400 MG/5ML PO SUSP: 30.0000 mL | Freq: Every day | ORAL | Status: DC | PRN

## 2020-12-30 MED ORDER — ALUM & MAG HYDROXIDE-SIMETH 200-200-20 MG/5ML PO SUSP: 30.0000 mL | ORAL | Status: DC | PRN

## 2020-12-30 MED ORDER — ACETAMINOPHEN 325 MG PO TABS: 650.0000 mg | ORAL_TABLET | Freq: Four times a day (QID) | ORAL | Status: DC | PRN

## 2020-12-30 NOTE — ED Provider Notes (Signed)
Behavioral Health Admission H&P [facility])  Date: 12/30/20 Patient Name: Jesse Bryan MRN: [id number] Chief Complaint:  Chief Complaint  Patient presents with  . Paranoid   Chief Complaint/Presenting Problem: NA  Diagnoses:  Final diagnoses:  Schizoaffective disorder, bipolar type (HCC)  Paranoia (HCC)    HPI: Jesse Bryan is a 25 year old male with a history of acute psychosis, substance-induced psychotic disorder, and paranoia who presents to the behavioral health urgent care voluntarily for concerns regarding worsening changes in behavior and paranoia.  Patient is accompanied by his mother-in-law on exam, as well as his fiance and mother for a portion of the exam. With patient's consent, collateral information was obtained from the patient, the patient's mother-in-law Jesse Bryan: [phone]), the patient's mother Jesse Bryan: [phone]), the patient's fianc Jesse Bryan: [phone]), and the patient's mother's fianc for this encounter.  Per chart review, patient presented to the behavioral health urgent care on 12/11/2020 for paranoia and delusional behavior that began after he had returned from a trip to [place].  Patient was evaluated at the behavioral health urgent care and inpatient psychiatric treatment was recommended for the patient at that time.  Due to patient's behavior on the [facility] continuous assessment unit at that time, patient was IVC'd and sent to the ED with inpatient treatment still recommended at that time.  Patient was then followed in the ED by psychiatry for multiple days and was eventually psych cleared and discharged from the ED on 12/15/2020.  Patient requests that the exam room door be closed during the encounter so people cannot watch him when they walk by.  Patient's mother-in-law and mother state that the patient's behavior change began when he returned from [place] prior to his last behavioral health urgent care visit.   When patient is asked why he is back at the behavioral health urgent care this evening, he states "I didn't do anything wrong.  Don't believe everything you read.  I have been thinking too much."  Patient states that his brain has been "going like this", and makes gestures pointing from wall to wall with his hands.  He states that he "feels like people are watching me through social media.  Everyone is trying to keep tabs on me.  They're all these negative people that are trying to keep tabs on me.  Everyone has negative energy that is affecting me.  I don't want people keeping tabs on me."  Patient's mother-in-law states that social media is a trigger for the patient's paranoia.  Patient states that he is taking a social media cleanse from all of his social media platforms due to him believing that people are watching him through social media.  He denies SI, past suicide attempts, or history of self harming behaviors such as cutting or burning himself.  He denies HI and AVH.  Patient states that he has been sleeping normally, but patient's mother-in-law mother states that the patient does not sleep at all and has not slept at all much over the past couple of days.  Patient denies anhedonia.  He states that his energy has been increased recently and he denies any changes in concentration.  He endorses increased appetite recently but denies recent weight changes.  Patient's mother states that the patient did not receive any prescriptions for psychotropic medications when he was released from the emergency department in January 2022.  Patient states that he has not seen a psychiatrist or therapist since he was discharged from the ED in January 2022.  He denies any history of receiving inpatient psychiatric treatment.  Patient states that he "wants professional help.  I don't really like talking to dudes.  I feel paranoid talking to you right now."  Patient lives in [place] with his fiance.  Patient denies any  access to guns or weapons.  Patient states that he uses marijuana occasionally and that he used marijuana when he was in [place] recently.  When patient is asked the last time he used marijuana, patient does not provide an answer.  Patient denies alcohol use or any additional substance use.  Patient states that he attended community college in the past, majoring in IT in business administration, but states that he did not finish college.  He states that his main support systems are his fiance and mother-in-law.   On exam, patient is sitting in a chair, looking out the doorway when someone walks down the hallway, appearing anxious and paranoid, but in no acute distress.  When he hears someone walk down the hallway, he states "I don't know what's around the corner".  His stated mood is anxious and paranoid with congruent affect.  Patient is alert and oriented x4, cooperative, and answers most questions appropriately.  Patient does not appear to be responding to internal stimuli.  With patient's consent, collateral information was obtained by myself and Celedonio Miyamoto, LCSW.  That collateral information obtained is shown below (as written in Jesse Bryan's note):  "Collateral from family: Since patient returned from [place] nearly a month ago he has displayed bizarre behavior, increased energy, and paranoia. They also state that patient goes several days at a time without sleeping. They do not feel patient is a danger to himself or others but feel he needs medications."  PHQ 2-9:   Flowsheet Row ED from 12/11/2020 in [facility] EMERGENCY DEPARTMENT  C-SSRS RISK CATEGORY Error: Q3, 4, or 5 should not be populated when Q2 is No       Total Time spent with patient: 30 minutes  Musculoskeletal  Strength & Muscle Tone: within normal limits Gait & Station: normal Patient leans: N/A  Psychiatric Specialty Exam  Presentation General Appearance: Appropriate for Environment;  Well Groomed  Eye Contact:Good  Speech:Clear and Coherent; Normal Rate  Speech Volume:Normal  Handedness:Right   Mood and Affect  Mood:Anxious (Paranoid)  Affect:Congruent   Thought Process  Thought Processes:Disorganized  Descriptions of Associations:Circumstantial  Orientation:Full (Time, Place and Person)  Thought Content:Paranoid Ideation; Delusions  Hallucinations:Hallucinations: None  Ideas of Reference:Paranoia; Delusions  Suicidal Thoughts:Suicidal Thoughts: No  Homicidal Thoughts:Homicidal Thoughts: No   Sensorium  Memory:Immediate Fair; Recent Fair; Remote Fair  Judgment:Intact  Insight:Lacking   Executive Functions  Concentration:Fair  Attention Span:Fair  Recall:Fair  Fund of Knowledge:Fair  Language:Good   Psychomotor Activity  Psychomotor Activity:Psychomotor Activity: Restlessness   Assets  Assets:Desire for Improvement; Manufacturing systems engineer; Housing; Leisure Time; Physical Health; Social Support; Transportation   Sleep  Sleep:Sleep: Poor   Physical Exam Vitals reviewed.  Constitutional:      General: He is not in acute distress.    Appearance: He is not ill-appearing, toxic-appearing or diaphoretic.  HENT:     Head: Normocephalic and atraumatic.     Right Ear: External ear normal.     Left Ear: External ear normal.  Cardiovascular:     Rate and Rhythm: Normal rate.  Pulmonary:     Effort: Pulmonary effort is normal. No respiratory distress.  Musculoskeletal:        General: Normal range of  motion.     Cervical back: Normal range of motion.  Neurological:     Mental Status: He is alert and oriented to person, place, and time.  Psychiatric:        Attention and Perception: He does not perceive auditory or visual hallucinations.        Behavior: Behavior is not agitated, slowed, aggressive, withdrawn, hyperactive or combative. Behavior is cooperative.        Thought Content: Thought content is paranoid and  delusional. Thought content does not include homicidal or suicidal ideation.     Comments: Stated mood is anxious and paranoid with congruent affect.  Judgment intact, but insight lacking.    Review of Systems  Constitutional: Negative for chills, diaphoresis, fever, malaise/fatigue and weight loss.  HENT: Negative for congestion.   Respiratory: Negative for cough and shortness of breath.   Cardiovascular: Negative for chest pain and palpitations.  Gastrointestinal: Negative for abdominal pain, diarrhea, nausea and vomiting.  Musculoskeletal: Negative for joint pain and myalgias.  Neurological: Negative for dizziness and headaches.  Psychiatric/Behavioral: Positive for substance abuse. Negative for depression, memory loss and suicidal ideas. The patient is nervous/anxious and has insomnia.   All other systems reviewed and are negative.   Vitals: Blood pressure (!) 140/92, pulse 86, temperature 97.7 F (36.5 C), temperature source Temporal, resp. rate 18, height  (1.803 m), weight 130 lb (59 kg), SpO2 99 %. Body mass index is 18.13 kg/m.  Past Psychiatric History: acute psychosis, substance-induced psychotic disorder, paranoia   Is the patient at risk to self? Yes  Has the patient been a risk to self in the past 6 months? Yes .    Has the patient been a risk to self within the distant past? No   Is the patient a risk to others? No   Has the patient been a risk to others in the past 6 months? No   Has the patient been a risk to others within the distant past? No   Past Medical History: No past medical history on file. No past surgical history on file.  Family History: No family history on file.  Social History:  Social History   Socioeconomic History  . Marital status: Single    Spouse name: Not on file  . Number of children: Not on file  . Years of education: Not on file  . Highest education level: Not on file  Occupational History  . Not on file  Tobacco Use  .  Smoking status: Never Smoker  . Smokeless tobacco: Never Used  Vaping Use  . Vaping Use: Former  Substance and Sexual Activity  . Alcohol use: Not Currently    Comment: socially  . Drug use: Not Currently    Types: Marijuana    Comment: reports no use since [facility] visit  . Sexual activity: Yes  Other Topics Concern  . Not on file  Social History Narrative  . Not on file   Social Determinants of Health   Financial Resource Strain: Not on file  Food Insecurity: Not on file  Transportation Needs: Not on file  Physical Activity: Not on file  Stress: Not on file  Social Connections: Not on file  Intimate Partner Violence: Not on file    SDOH:  SDOH Screenings   Alcohol Screen: Not on file  Depression (OZH0-8): Not on file  Financial Resource Strain: Not on file  Food Insecurity: Not on file  Housing: Not on file  Physical Activity: Not on  file  Social Connections: Not on file  Stress: Not on file  Tobacco Use: Low Risk   . Smoking Tobacco Use: Never Smoker  . Smokeless Tobacco Use: Never Used  Transportation Needs: Not on file    Last Labs:  Admission on 12/11/2020, Discharged on 12/15/2020  Component Date Value Ref Range Status  . Sodium 12/11/2020 136  135 - 145 mmol/L Final  . Potassium 12/11/2020 3.1* 3.5 - 5.1 mmol/L Final  . Chloride 12/11/2020 101  98 - 111 mmol/L Final  . CO2 12/11/2020 19* 22 - 32 mmol/L Final  . Glucose, Bld 12/11/2020 130* 70 - 99 mg/dL Final   Glucose reference range applies only to samples taken after fasting for at least 8 hours.  . BUN 12/11/2020 10  6 - 20 mg/dL Final  . Creatinine, Ser 12/11/2020 1.34* 0.61 - 1.24 mg/dL Final  . Calcium 78/46/9629 9.6  8.9 - 10.3 mg/dL Final  . Total Protein 12/11/2020 7.5  6.5 - 8.1 g/dL Final  . Albumin 52/84/1324 4.5  3.5 - 5.0 g/dL Final  . AST 40/08/2724 66* 15 - 41 U/L Final  . ALT 12/11/2020 26  0 - 44 U/L Final  . Alkaline Phosphatase 12/11/2020 51  38 - 126 U/L Final  . Total Bilirubin  12/11/2020 1.1  0.3 - 1.2 mg/dL Final  . GFR, Estimated 12/11/2020 >60  >60 mL/min Final   Comment: (NOTE) Calculated using the CKD-EPI Creatinine Equation (2021)   . Anion gap 12/11/2020 16* 5 - 15 Final   Performed at [facility], [address], [place], [place] [address]  . Alcohol, Ethyl (B) 12/11/2020 <10  <10 mg/dL Final   Comment: (NOTE) Lowest detectable limit for serum alcohol is 10 mg/dL.  For medical purposes only. Performed at [facility], [address] [place], [place] [address]   . Salicylate Lvl 12/11/2020 <7.0* 7.0 - 30.0 mg/dL Final   Performed at [facility], [address], [place], [place] [address]  . Acetaminophen (Tylenol), Serum 12/11/2020 <10* 10 - 30 ug/mL Final   Comment: (NOTE) Therapeutic concentrations vary significantly. A range of 10-30 ug/mL  may be an effective concentration for many patients. However, some  are best treated at concentrations outside of this range. Acetaminophen concentrations >150 ug/mL at 4 hours after ingestion  and >50 ug/mL at 12 hours after ingestion are often associated with  toxic reactions.  Performed at [facility], [address], [place], [place] [address]   . WBC 12/11/2020 10.3  4.0 - 10.5 K/uL Final  . RBC 12/11/2020 4.34  4.22 - 5.81 MIL/uL Final  . Hemoglobin 12/11/2020 13.5  13.0 - 17.0 g/dL Final  . HCT 43/32/9518 39.3  39.0 - 52.0 % Final  . MCV 12/11/2020 90.6  80.0 - 100.0 fL Final  . MCH 12/11/2020 31.1  26.0 - 34.0 pg Final  . MCHC 12/11/2020 34.4  30.0 - 36.0 g/dL Final  . RDW 84/16/6063 12.1  11.5 - 15.5 % Final  . Platelets 12/11/2020 292  150 - 400 K/uL Final  . nRBC 12/11/2020 0.0  0.0 - 0.2 % Final   Performed at [facility], [address], [place], [place] [address]  Admission on 12/10/2020, Discharged on 12/11/2020  Component Date Value Ref Range Status  . SARS Coronavirus 2 by RT PCR 12/11/2020 NEGATIVE  NEGATIVE Final   Comment: (NOTE) SARS-CoV-2  target nucleic acids are NOT DETECTED.  The SARS-CoV-2 RNA is  generally detectable in upper respiratory specimens during the acute phase of infection. The lowest concentration of SARS-CoV-2 viral copies this assay can detect is 138 copies/mL. A negative result does not preclude SARS-Cov-2 infection and should not be used as the sole basis for treatment or other patient management decisions. A negative result may occur with  improper specimen collection/handling, submission of specimen other than nasopharyngeal swab, presence of viral mutation(s) within the areas targeted by this assay, and inadequate number of viral copies(<138 copies/mL). A negative result must be combined with clinical observations, patient history, and epidemiological information. The expected result is Negative.  Fact Sheet for Patients:  BloggerCourse.comhttps://www.fda.gov/media/152166/download  Fact Sheet for Healthcare Providers:  SeriousBroker.ithttps://www.fda.gov/media/152162/download  This test is no                          t yet approved or cleared by the [place] FDA and  has been authorized for detection and/or diagnosis of SARS-CoV-2 by FDA under an Emergency Use Authorization (EUA). This EUA will remain  in effect (meaning this test can be used) for the duration of the COVID-19 declaration under Section 564(b)(1) of the Act, 21 U.S.C.section 360bbb-3(b)(1), unless the authorization is terminated  or revoked sooner.      . Influenza A by PCR 12/11/2020 NEGATIVE  NEGATIVE Final  . Influenza B by PCR 12/11/2020 NEGATIVE  NEGATIVE Final   Comment: (NOTE) The Xpert Xpress SARS-CoV-2/FLU/RSV plus assay is intended as an aid in the diagnosis of influenza from Nasopharyngeal swab specimens and should not be used as a sole basis for treatment. Nasal washings and aspirates are unacceptable for Xpert Xpress SARS-CoV-2/FLU/RSV testing.  Fact Sheet for Patients: BloggerCourse.comhttps://www.fda.gov/media/152166/download  Fact Sheet for  Healthcare Providers: SeriousBroker.ithttps://www.fda.gov/media/152162/download  This test is not yet approved or cleared by the [place] FDA and has been authorized for detection and/or diagnosis of SARS-CoV-2 by FDA under an Emergency Use Authorization (EUA). This EUA will remain in effect (meaning this test can be used) for the duration of the COVID-19 declaration under Section 564(b)(1) of the Act, 21 U.S.C. section 360bbb-3(b)(1), unless the authorization is terminated or revoked.  Performed at [facility], [address] [place], [place] [id number]   . POC Amphetamine UR 12/11/2020 None Detected  NONE DETECTED (Cut Off Level 1000 ng/mL) Preliminary  . POC Secobarbital (BAR) 12/11/2020 None Detected  NONE DETECTED (Cut Off Level 300 ng/mL) Preliminary  . POC Buprenorphine (BUP) 12/11/2020 None Detected  NONE DETECTED (Cut Off Level 10 ng/mL) Preliminary  . POC Oxazepam (BZO) 12/11/2020 None Detected  NONE DETECTED (Cut Off Level 300 ng/mL) Preliminary  . POC Cocaine UR 12/11/2020 None Detected  NONE DETECTED (Cut Off Level 300 ng/mL) Preliminary  . POC Methamphetamine UR 12/11/2020 None Detected  NONE DETECTED (Cut Off Level 1000 ng/mL) Preliminary  . POC Morphine 12/11/2020 None Detected  NONE DETECTED (Cut Off Level 300 ng/mL) Preliminary  . POC Oxycodone UR 12/11/2020 None Detected  NONE DETECTED (Cut Off Level 100 ng/mL) Preliminary  . POC Methadone UR 12/11/2020 None Detected  NONE DETECTED (Cut Off Level 300 ng/mL) Preliminary  . POC Marijuana UR 12/11/2020 Positive* NONE DETECTED (Cut Off Level 50 ng/mL) Preliminary    Allergies: Penicillins  PTA Medications: (Not in a hospital admission)   Medical Decision Making  Patient is a 25 year old male who presents to the behavioral health urgent care voluntarily for paranoia and delusions shortly after presenting to the behavioral health urgent care for similar  presentation on 12/11/2020.  Based on patient's presentation, my  assessment, and collateral information obtained, patient appears to be experiencing psychosis that has persisted since his last presentation in January 2022.  Do not believe that the patient's presentation is substance-induced at this point.  Patient's paranoia and delusions appear to be significantly negatively impacting his life and activities of daily living.  Recommend inpatient psychiatric treatment for the patient at this time.    Recommendations  Based on my evaluation the patient does not appear to have an emergency medical condition. Recommend inpatient psychiatric treatment for the patient at this time. Per Joslyn Devon, [facility], there are currently no appropriate beds available for the patient at [facility] at this time. We'll place the patient in behavioral health urgent care continuous observation/assessment for further stabilization and treatment, while the patient is awaiting appropriate placement for an inpatient psychiatric treatment facility.  Social work to seek placement for inpatient treatment for this patient. The patient will be reevaluated by the treatment team on 12/31/2020.  Labs ordered and reviewed:  -PCR COVID: Negative   -UDS: Positive for marijuana  -CBC with differential: Red blood cell count slightly reduced at 4.14 MIL/uL and hematocrit slightly reduced at 36.9%, otherwise unremarkable  -CMP: Potassium slightly reduced at 3.3 mmol/L, otherwise unremarkable  -TSH, hemoglobin A1c, and lipid panel ordered due to initiation of antipsychotic medications: TSH and hemoglobin A1c within normal limits.  Lipid panel shows cholesterol slightly elevated at 214 mg/dL, LDL cholesterol slightly elevated at 151 mg/dL.  Otherwise unremarkable.  Believe that the patient can still receive antipsychotic medications at this time.  -EKG ordered to assess patient's QTC due to initiation of antipsychotic medications.  EKG shows no acute/concerning findings with QTC of 421 ms.  QTc is appropriate  to continue antipsychotic medications. Patient not taking any home medications at this time.  Will initiate Seroquel 50 mg p.o. daily at bedtime for patient's psychosis. Vistaril 25 mg p.o. 3 times daily as needed ordered for anxiety and trazodone 50 mg p.o. as needed at bedtime ordered for sleep. One-time dose of potassium chloride 40 MDQ's p.o. ordered to address patient's mild hypokalemia.  Jaclyn Shaggy, PA-C 12/30/20  11:28 PM

## 2020-12-30 NOTE — BH Assessment (Signed)
Comprehensive Clinical Assessment (CCA) Note  12/30/2020 [place] [facility] [id number]   Patient is a 25 year old male presenting voluntarily to [facility] for assessment. Patient is accompanied by his mother, Bonita Quin, girlfriend, Dondra Prader, and mother in Social worker, [place]. Bethann Berkshire joins patient for assessment at his request. Collateral obtained from rest of family after assessment. Patient is a poor historian due to AMS. Patient endorses paranoid delusions that people are tracking him through social media and cell phones. He also repeatedly expresses paranoia that his family may not be safe. Patient was seen at [facility] on 1/25 with a similar presentation after ingesting THC. He denies any recent substance use. He denies SI/HI/AVH. Patient is preoccupied with the idea of "negativity" and repeatedly states he needs to separate himself from it. Patient is looking around the room and consistently wanting to know who is out in the hallway and the rest of the building. After discussion of symptoms patient is agreeable to stay and be admitted to continuous assessment.   Collateral from family: Since patient returned from [place] nearly a month ago he has displayed bizarre behavior, increased energy, and paranoia. They also state that patient goes several days at a time without sleeping. They do not feel patient is a danger to himself or others but feel he needs medications.   Melbourne Abts, PA recommends in patient treatment. Per Rayfield Citizen, RN/AC at [facility] no thought disorder beds available at this time. Patient will be admitted to continuous assessment and placement will be sought.   Chief Complaint:  Chief Complaint  Patient presents with  . Paranoid   Visit Diagnosis: F25.0 Schizoaffective Disorder, Bipolar type    CCA Biopsychosocial Intake/Chief Complaint:  NA  Current Symptoms/Problems: NA   Patient Reported Schizophrenia/Schizoaffective Diagnosis in Past: Yes   Strengths: NA  Preferences: NA  Abilities:  NA   Type of Services Patient Feels are Needed: NA   Initial Clinical Notes/Concerns: NA   Mental Health Symptoms Depression:  Difficulty Concentrating; Fatigue; Irritability; Sleep (too much or little); Increase/decrease in appetite; Change in energy/activity (insomnia for three days)   Duration of Depressive symptoms: Greater than two weeks   Mania:  Racing thoughts; Overconfidence; Increased Energy; Change in energy/activity (pt feels thoughts are racing out of control)   Anxiety:   Irritability; Restlessness; Sleep   Psychosis:  Grossly disorganized speech; Delusions (paranoia)   Duration of Psychotic symptoms: Less than six months   Trauma:  None   Obsessions:  Recurrent & persistent thoughts/impulses/images (paranoia; feels girlfriend is cheating; feels others are following him)   Compulsions:  N/A   Inattention:  None   Hyperactivity/Impulsivity:  N/A   Oppositional/Defiant Behaviors:  Aggression towards people/animals (pt was trying to fight with brother in lobby)   Emotional Irregularity:  Mood lability   Other Mood/Personality Symptoms:  No data recorded   Mental Status Exam Appearance and self-care  Stature:  Tall   Weight:  Average weight   Clothing:  Neat/clean   Grooming:  Normal   Cosmetic use:  None   Posture/gait:  Normal (laying down throughout assessment)   Motor activity:  Not Remarkable   Sensorium  Attention:  Distractible   Concentration:  Focuses on irrelevancies; Scattered; Variable   Orientation:  Person; Place; Situation; Object   Recall/memory:  Normal   Affect and Mood  Affect:  Anxious; Full Range   Mood:  Anxious; Hypomania   Relating  Eye contact:  Fleeting   Facial expression:  Responsive   Attitude toward examiner:  Cooperative  Thought and Language  Speech flow: Flight of Ideas   Thought content:  Delusions   Preoccupation:  Ruminations   Hallucinations:  None   Organization:  No data recorded   [employer] of Knowledge:  Average (impairment)   Intelligence:  Average   Abstraction:  Curator (uta)   Judgement:  Impaired   Reality Testing:  Distorted   Insight:  Gaps   Decision Making:  Confused   Social Functioning  Social Maturity:  Responsible   Social Judgement:  Heedless   Stress  Stressors:  Illness; Family conflict   Coping Ability:  Deficient supports   Skill Deficits:  Self-control; Activities of daily living; Decision making   Supports:  Family     Religion: Religion/Spirituality Are You A Religious Person?: Yes What is Your Religious Affiliation?: Christian How Might This Affect Treatment?: speaks about God helping him  Leisure/Recreation: Leisure / Recreation Do You Have Hobbies?: Yes Leisure and Hobbies: travel  Exercise/Diet: Exercise/Diet Do You Exercise?: No Have You Gained or Lost A Significant Amount of Weight in the Past Six Months?: No Do You Follow a Special Diet?: No Do You Have Any Trouble Sleeping?: Yes Explanation of Sleeping Difficulties: he states he sleeps-collateral contacts say he does not   CCA Employment/Education Employment/Work Situation: Employment / Work Situation Employment situation: Unemployed Patient's job has been impacted by current illness: No What is the longest time patient has a held a job?: [employer] Where was the patient employed at that time?: [employer] Has patient ever been in the [employer]?: No  Education: Education Is Patient Currently Attending School?: No Last Grade Completed: 12 Name of High School: [employer] Did Garment/textile technologist From [employer]?: Yes Did Theme park manager?: Yes What Type of College Degree Do you Have?: did not finish- studied business Did Designer, television/film set?: No Did You Have An Individualized Education Program (IIEP): No Did You Have Any Difficulty At School?: No Patient's Education Has Been Impacted by Current Illness: No   CCA Family/Childhood History Family  and Relationship History:    Childhood History:  Childhood History By whom was/is the patient raised?: Mother Additional childhood history information: father left at age 24 Description of patient's relationship with caregiver when they were a child: stable Patient's description of current relationship with people who raised him/her: stable Does patient have siblings?: Yes Description of patient's current relationship with siblings: brother--get along sometimes Did patient suffer any verbal/emotional/physical/sexual abuse as a child?: No Did patient suffer from severe childhood neglect?: No Has patient ever been sexually abused/assaulted/raped as an adolescent or adult?: No Was the patient ever a victim of a crime or a disaster?: No Witnessed domestic violence?: No Has patient been affected by domestic violence as an adult?: No  Child/Adolescent Assessment:     CCA Substance Use Alcohol/Drug Use: Alcohol / Drug Use Pain Medications: see MAR Prescriptions: see MAR Over the Counter: see MAR History of alcohol / drug use?: Yes Substance #1 Name of Substance 1: marijuana 1 - Amount (size/oz): vaiable 1 - Frequency: variable 1 - Last Use / Amount: 1/25 Substance #2 Name of Substance 2: etoh 2 - Frequency: variable 2 - Last Use / Amount: 1/25                     ASAM's:  Six Dimensions of Multidimensional Assessment  Dimension 1:  Acute Intoxication and/or Withdrawal Potential:      Dimension 2:  Biomedical Conditions and Complications:   Dimension 2:  Description of patient's biomedical conditions and  complications: good physical health  Dimension 3:  Emotional, Behavioral, or Cognitive Conditions and Complications:     Dimension 4:  Readiness to Change:     Dimension 5:  Relapse, Continued use, or Continued Problem Potential:     Dimension 6:  Recovery/Living Environment:     ASAM Severity Score:    ASAM Recommended Level of Treatment:     Substance use  Disorder (SUD)    Recommendations for Services/Supports/Treatments: Recommendations for Services/Supports/Treatments Recommendations For Services/Supports/Treatments: Other (Comment) (overnight observation [facility])  DSM5 Diagnoses: Patient Active Problem List   Diagnosis Date Noted  . Acute psychosis (HCC) 12/11/2020  . 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

## 2020-12-30 NOTE — ED Triage Notes (Signed)
Patient arrives as walkin with mom & other support people. Reports 'I feel paranoid because I feel like people are watching me but I don't know who.' Patient denies SI/HI won't clearly answer about AVH currently. Does endorse history of seeing things  - at last visit. Per mom patient not sleeping, very paranoid, took her phone and threw it down the stairs. Constantly calling to check on people.

## 2020-12-30 NOTE — ED Notes (Signed)
Mom asked to speak to RN alone, pt agreed. Mom states she is concerned at the ongoing behaviors since their return from [place] approx 3 weeks ago. RN did educate about IVC process and encouraged mom that if she felt it necessary she could initiate the process. Mom expressed desire for patient to get medication to help him sleep tonight but that she wanted to take him home and she would talk to lawyer and potentially initiate IVC & power of attorney paperwork tomorrow. Stated they have an appointment with [facility] on Tuesday. This collateral shared with Sharyl Nimrod from [facility] and Kimberly PA. They will assess and determine best plan along with patient, mom, & patient's fiancee.

## 2020-12-30 NOTE — ED Notes (Signed)
PATIENT BELONGINGS ARE STORED IN LOCKER 11 

## 2020-12-31 ENCOUNTER — Other Ambulatory Visit: Payer: Self-pay

## 2020-12-31 ENCOUNTER — Encounter (HOSPITAL_COMMUNITY): Payer: Self-pay | Admitting: Psychiatry

## 2020-12-31 ENCOUNTER — Inpatient Hospital Stay (HOSPITAL_COMMUNITY)
Admission: AD | Admit: 2020-12-31 | Discharge: 2021-01-13 | DRG: 885 | Disposition: A | Discharge status: Home / Self Care | Payer: Federal, State, Local not specified - Other | Source: Intra-hospital | Attending: Psychiatry | Admitting: Psychiatry

## 2020-12-31 DIAGNOSIS — Z820 Family history of epilepsy and other diseases of the nervous system: Secondary | ICD-10-CM

## 2020-12-31 DIAGNOSIS — F29 Unspecified psychosis not due to a substance or known physiological condition: Secondary | ICD-10-CM | POA: Diagnosis not present

## 2020-12-31 DIAGNOSIS — F12959 Cannabis use, unspecified with psychotic disorder, unspecified: Secondary | ICD-10-CM | POA: Diagnosis not present

## 2020-12-31 DIAGNOSIS — G4700 Insomnia, unspecified: Secondary | ICD-10-CM | POA: Diagnosis present

## 2020-12-31 DIAGNOSIS — F419 Anxiety disorder, unspecified: Secondary | ICD-10-CM | POA: Diagnosis present

## 2020-12-31 DIAGNOSIS — F209 Schizophrenia, unspecified: Secondary | ICD-10-CM | POA: Diagnosis not present

## 2020-12-31 LAB — COMPREHENSIVE METABOLIC PANEL
ALT: 25 U/L (ref 0–44)
AST: 25 U/L (ref 15–41)
Albumin: 4.2 g/dL (ref 3.5–5.0)
Alkaline Phosphatase: 48 U/L (ref 38–126)
Anion gap: 12 (ref 5–15)
BUN: 12 mg/dL (ref 6–20)
CO2: 23 mmol/L (ref 22–32)
Calcium: 9.2 mg/dL (ref 8.9–10.3)
Chloride: 104 mmol/L (ref 98–111)
Creatinine, Ser: 1.06 mg/dL (ref 0.61–1.24)
GFR, Estimated: >60 mL/min (ref >60)
Glucose, Bld: 93 mg/dL (ref 70–99)
Potassium: 3.3 mmol/L — ABNORMAL LOW (ref 3.5–5.1)
Sodium: 139 mmol/L (ref 135–145)
Total Bilirubin: 0.9 mg/dL (ref 0.3–1.2)
Total Protein: 7.3 g/dL (ref 6.5–8.1)

## 2020-12-31 LAB — CBC WITH DIFFERENTIAL/PLATELET
Abs Immature Granulocytes: 0.02 10*3/uL (ref 0.00–0.07)
Basophils Absolute: 0 10*3/uL (ref 0.0–0.1)
Basophils Relative: 1 %
Eosinophils Absolute: 0.1 10*3/uL (ref 0.0–0.5)
Eosinophils Relative: 1 %
HCT: 36.9 % — ABNORMAL LOW (ref 39.0–52.0)
Hemoglobin: 13 g/dL (ref 13.0–17.0)
Immature Granulocytes: 0 %
Lymphocytes Relative: 24 %
Lymphs Abs: 1.8 10*3/uL (ref 0.7–4.0)
MCH: 31.4 pg (ref 26.0–34.0)
MCHC: 35.2 g/dL (ref 30.0–36.0)
MCV: 89.1 fL (ref 80.0–100.0)
Monocytes Absolute: 0.5 10*3/uL (ref 0.1–1.0)
Monocytes Relative: 7 %
Neutro Abs: 5.3 10*3/uL (ref 1.7–7.7)
Neutrophils Relative %: 67 %
Platelets: 346 10*3/uL (ref 150–400)
RBC: 4.14 MIL/uL — ABNORMAL LOW (ref 4.22–5.81)
RDW: 12.4 % (ref 11.5–15.5)
WBC: 7.8 10*3/uL (ref 4.0–10.5)
nRBC: 0 % (ref 0.0–0.2)

## 2020-12-31 LAB — LIPID PANEL
Cholesterol: 214 mg/dL — ABNORMAL HIGH (ref 0–200)
HDL: 47 mg/dL (ref >40)
LDL Cholesterol: 151 mg/dL — ABNORMAL HIGH (ref 0–99)
Total CHOL/HDL Ratio: 4.6 RATIO
Triglycerides: 80 mg/dL (ref <150)
VLDL: 16 mg/dL (ref 0–40)

## 2020-12-31 LAB — POCT URINE DRUG SCREEN - MANUAL ENTRY (I-SCREEN)
POC Amphetamine UR: NOT DETECTED
POC Buprenorphine (BUP): NOT DETECTED
POC Cocaine UR: NOT DETECTED
POC Marijuana UR: POSITIVE — AB
POC Methadone UR: NOT DETECTED
POC Methamphetamine UR: NOT DETECTED
POC Morphine: NOT DETECTED
POC Oxazepam (BZO): NOT DETECTED
POC Oxycodone UR: NOT DETECTED
POC Secobarbital (BAR): NOT DETECTED

## 2020-12-31 LAB — TSH: TSH: 1.805 u[IU]/mL (ref 0.350–4.500)

## 2020-12-31 LAB — HEMOGLOBIN A1C
Hgb A1c MFr Bld: 5.5 % (ref 4.8–5.6)
Mean Plasma Glucose: 111.15 mg/dL

## 2020-12-31 LAB — RESP PANEL BY RT-PCR (FLU A&B, COVID) ARPGX2
Influenza A by PCR: NEGATIVE
Influenza B by PCR: NEGATIVE
SARS Coronavirus 2 by RT PCR: NEGATIVE

## 2020-12-31 LAB — POC SARS CORONAVIRUS 2 AG -  ED: SARS Coronavirus 2 Ag: NEGATIVE

## 2020-12-31 MED ORDER — LORAZEPAM 1 MG PO TABS: 1.0000 mg | ORAL_TABLET | ORAL | Status: DC | PRN

## 2020-12-31 MED ORDER — ZIPRASIDONE MESYLATE 20 MG IM SOLR: 20.0000 mg | Freq: Four times a day (QID) | INTRAMUSCULAR | Status: DC | PRN

## 2020-12-31 MED ORDER — MAGNESIUM HYDROXIDE 400 MG/5ML PO SUSP: 30.0000 mL | Freq: Every day | ORAL | Status: DC | PRN

## 2020-12-31 MED ORDER — OLANZAPINE 10 MG PO TBDP: 10.0000 mg | ORAL_TABLET | Freq: Three times a day (TID) | ORAL | Status: DC | PRN

## 2020-12-31 MED ORDER — HYDROXYZINE HCL 25 MG PO TABS
25.0000 mg | ORAL_TABLET | Freq: Three times a day (TID) | ORAL | Status: DC | PRN
  Administered 2020-12-31 – 2021-01-04 (×7): 25 mg via ORAL
  Filled 2020-12-31 (×9): qty 1

## 2020-12-31 MED ORDER — RISPERIDONE 2 MG PO TBDP: 2.0000 mg | ORAL_TABLET | Freq: Three times a day (TID) | ORAL | Status: DC | PRN

## 2020-12-31 MED ORDER — RISPERIDONE 2 MG PO TBDP
3.0000 mg | ORAL_TABLET | Freq: Every day | ORAL | Status: DC
  Administered 2020-12-31 – 2021-01-01 (×2): 3 mg via ORAL
  Filled 2020-12-31 (×4): qty 1

## 2020-12-31 MED ORDER — LORAZEPAM 1 MG PO TABS: 1.0000 mg | ORAL_TABLET | Freq: Four times a day (QID) | ORAL | Status: DC | PRN

## 2020-12-31 MED ORDER — ACETAMINOPHEN 325 MG PO TABS: 650.0000 mg | ORAL_TABLET | Freq: Four times a day (QID) | ORAL | Status: DC | PRN

## 2020-12-31 MED ORDER — RISPERIDONE 1 MG PO TBDP: 1.0000 mg | ORAL_TABLET | Freq: Two times a day (BID) | ORAL | Status: DC

## 2020-12-31 MED ORDER — RISPERIDONE 2 MG PO TBDP
2.0000 mg | ORAL_TABLET | Freq: Every day | ORAL | Status: DC
  Administered 2021-01-01 – 2021-01-02 (×2): 2 mg via ORAL
  Filled 2020-12-31 (×3): qty 1

## 2020-12-31 MED ORDER — POTASSIUM CHLORIDE CRYS ER 20 MEQ PO TBCR
40.0000 meq | EXTENDED_RELEASE_TABLET | Freq: Once | ORAL | Status: AC
  Administered 2020-12-31: 40 meq via ORAL
  Filled 2020-12-31 (×2): qty 2

## 2020-12-31 MED ORDER — ALUM & MAG HYDROXIDE-SIMETH 200-200-20 MG/5ML PO SUSP
30.0000 mL | ORAL | Status: DC | PRN
  Administered 2021-01-12: 30 mL via ORAL
  Filled 2020-12-31: qty 30

## 2020-12-31 MED ORDER — TRAZODONE HCL 50 MG PO TABS
50.0000 mg | ORAL_TABLET | Freq: Every evening | ORAL | Status: DC | PRN
  Administered 2020-12-31 – 2021-01-07 (×6): 50 mg via ORAL
  Filled 2020-12-31 (×8): qty 1

## 2020-12-31 MED ORDER — ZIPRASIDONE MESYLATE 20 MG IM SOLR: 20.0000 mg | INTRAMUSCULAR | Status: DC | PRN

## 2020-12-31 NOTE — ED Notes (Addendum)
Patient up and down. Refusing to sleep as he says he doesn't feel safe due to being around people he doesn't know. Continues with paranoid ideations and tangential conversations including talking about social media, issues with friends, the good and the bad inside of him. Somewhat redirectable to sit on pull out bed but then returns to counter and wants to talk. Pacing unit & muttering to himself when staff not talking with him. Safety maintained

## 2020-12-31 NOTE — ED Notes (Signed)
Patient sleeping while sitting upright. In no distress, safety maintained.

## 2020-12-31 NOTE — ED Notes (Signed)
Patient continues to be manic. Patient is somewhat restless. Patient paces around unit attempting to communicate with other non verbal Patient.

## 2020-12-31 NOTE — Progress Notes (Signed)
Received Read in the flex area standing at the nurses station constantly talking and jumping from one conversation to another with several requests at intervals. He remained busy throughout the morning and early evening. He is directable when necessary without confrontation. He received nourishments per his request throughout the day. Later report was called to nurse Lanora Manis at [facility], at 1550 hrs. His paperwork was processed and he was transported at 1612 hrs.

## 2020-12-31 NOTE — Progress Notes (Signed)
Admission Note: Patient is a 25 year old male admitted to the unit for symptoms of paranoia and altered mental status.  Patient appears guarded, suspicious and disorganized.  Paranoid about people out to get him.  Refused to sign consent form after several attempts.  Admission plan of care reviewed but refused to sign consent for treatment.  Skin assessment and personal belongings completed.  Skin is dry and intact.  No contraband found.  Patient oriented to the unit, staff and room.  Routine safety checks initiated.  Patient is safe on the unit.  Needed a lot of redirection upon getting to the unit.  Using racial slur and accusing staff of trying to give him the wrong medication.

## 2020-12-31 NOTE — Tx Team (Signed)
Initial Treatment Plan 12/31/2020 5:50 PM Tamarion W Shubert WNI:[id number]    PATIENT STRESSORS: Health problems Medication change or noncompliance   PATIENT STRENGTHS: Communication skills Supportive family/friends   PATIENT IDENTIFIED PROBLEMS: Paranoia  Medication noncompliance                   DISCHARGE CRITERIA:  Ability to meet basic life and health needs Adequate post-discharge living arrangements  PRELIMINARY DISCHARGE PLAN: Attend aftercare/continuing care group Attend 12-step recovery group Return to previous living arrangement  PATIENT/FAMILY INVOLVEMENT: This treatment plan has been presented to and reviewed with the patient, Amadu W Netzel.  The patient and family have been given the opportunity to ask questions and make suggestions.  Clarene Critchley, RN 12/31/2020, 5:50 PM

## 2020-12-31 NOTE — ED Notes (Addendum)
Patient cooperative with labs/EKG/skin assessment with lots of encouragement from fiancee & staff. Insisted on keeping assessment door locked unless someone he knew was going in or out. Requested RN stand outside bathroom so no one he didn't know would come by.  Escorted to obs area, staff sitting with him due to paranoid thought processes. Took meds, again with lots of reassurance and education from RN. Insisted on getting fresh water even though RN got water from the dispenser directly in front of patient. Initially placed in general adult obs area but paranoid thoughts expressed about being by window so trialing being in flex area.

## 2020-12-31 NOTE — Progress Notes (Signed)
Pt accepted to [facility] room 501-1  Patient meets inpatient criteria per Earlene Plater, MD .    Dr. Jola Babinski is the attending provider.    Call report to [phone]    Jonette Mate, RN @ [facility] notified via secure chat  Pt is IVC.    Pt may be transported by [facility]   Pt scheduled  to arrive at [facility] at 1600 PM  Signed:  Corky Crafts, MSW, [place], LCASA 12/31/2020 1:59 PM

## 2020-12-31 NOTE — Discharge Instructions (Addendum)
Transfer to [facility] °

## 2020-12-31 NOTE — ED Provider Notes (Addendum)
FBC/OBS ASAP Discharge Summary  Date and Time: 12/31/2020 1:14 PM  Name: Jesse Bryan  MRN:  [id number]   Discharge Diagnoses:  Final diagnoses:  Schizoaffective disorder, bipolar type (HCC)  Paranoia (HCC)    Subjective:  Patient interviewed this AM. He remains paranoid, disorganized, tangential. Patient has a magazine in front of him with pages ripped out and sections of different articles highlighted. Patient explains how the articles apply to him and explains the meanings of the articles and how they relate to his life; patient focused on "them". Unclear who "them" refers to. Patient reports having "fake friends" and that he does not need to be here. Patient appears to be paranoid of staff members; while stirring oatmeal patient makes comments about the"energies" of people and raises concern about how staff could have put penicillin in his oatmeal in an effort to harm him. Pt appears to be suspicious of Clinical research associate. Pt gives consent for me to speak with mother. Patient requesting to leave as he feels he does not need to be here.  Milan Perkins (mother): [phone] Called at 11:02 AM Says that he called her this morning and "sounds a little bit better" but states that he continues to be paranoid. Told her happy valentines day and that he loved me. He has always been very quiet and it is unusual for him to be talking a lot. He was at [facility] cone and they didn't give him any medications when he left. Wants him to be stabilized prior to discharge. He is not as bad as he was in January but he is still paranoid and not himself. This happens when he doesn't get his rest. Would like to be given updates. Informed that he would likely have to be IVC'd; verbalized understanding.   Stay Summary:    Jesse Bryan is a 25 year old male with a history of acute psychosis, substance-induced psychotic disorder, and paranoia who presents to the behavioral health urgent care voluntarily for concerns regarding  worsening changes in behavior and paranoia.  Patient is accompanied by his mother-in-law on exam, as well as his fiance and mother for a portion of the exam. With patient's consent, collateral information was obtained from the patient, the patient's mother-in-law Jesse Bryan: [phone]), the patient's mother Jesse Bryan: [phone]), the patient's fianc Jesse Bryan: [phone]), and the patient's mother's fianc for this encounter.  Per chart review, patient presented to the behavioral health urgent care on 12/11/2020 for paranoia and delusional behavior that began after he had returned from a trip to [place].  Patient was evaluated at the behavioral health urgent care and inpatient psychiatric treatment was recommended for the patient at that time.  Due to patient's behavior on the [facility] continuous assessment unit at that time, patient was IVC'd and sent to the ED with inpatient treatment still recommended at that time.  Patient was then followed in the ED by psychiatry for multiple days and was eventually psych cleared and discharged from the ED on 12/15/2020.  Patient requests that the exam room door be closed during the encounter so people cannot watch him when they walk by.  Patient's mother-in-law and mother state that the patient's behavior change began when he returned from [place] prior to his last behavioral health urgent care visit.  When patient is asked why he is back at the behavioral health urgent care this evening, he states "I didn't do anything wrong.  Don't believe everything you read.  I have been thinking too much."  Patient states that  his brain has been "going like this", and makes gestures pointing from wall to wall with his hands.  He states that he "feels like people are watching me through social media.  Everyone is trying to keep tabs on me.  They're all these negative people that are trying to keep tabs on me.  Everyone has negative energy that is  affecting me.  I don't want people keeping tabs on me."  Patient's mother-in-law states that social media is a trigger for the patient's paranoia.  Patient states that he is taking a social media cleanse from all of his social media platforms due to him believing that people are watching him through social media.  He denies SI, past suicide attempts, or history of self harming behaviors such as cutting or burning himself.  He denies HI and AVH.  Patient states that he has been sleeping normally, but patient's mother-in-law mother states that the patient does not sleep at all and has not slept at all much over the past couple of days.  Patient denies anhedonia.  He states that his energy has been increased recently and he denies any changes in concentration.  He endorses increased appetite recently but denies recent weight changes.   Patient admitted for safety and stabilization; was given 50 mg seroquel which he was compliant with. The following day, patient continued to be paranoid and psychotic. Changed medications to risperdal 1 mg BID.   Total Time spent with patient: 20 minutes  Past Psychiatric History: psychosis Past Medical History: No past medical history on file. No past surgical history on file. Family History: No family history on file. Family Psychiatric History: see H&P Social History:  Social History   Substance and Sexual Activity  Alcohol Use Not Currently   Comment: socially     Social History   Substance and Sexual Activity  Drug Use Not Currently  . Types: Marijuana   Comment: reports no use since [facility] visit    Social History   Socioeconomic History  . Marital status: Single    Spouse name: Not on file  . Number of children: Not on file  . Years of education: Not on file  . Highest education level: Not on file  Occupational History  . Not on file  Tobacco Use  . Smoking status: Never Smoker  . Smokeless tobacco: Never Used  Vaping Use  . Vaping Use: Former   Substance and Sexual Activity  . Alcohol use: Not Currently    Comment: socially  . Drug use: Not Currently    Types: Marijuana    Comment: reports no use since [facility] visit  . Sexual activity: Yes  Other Topics Concern  . Not on file  Social History Narrative  . Not on file   Social Determinants of Health   Financial Resource Strain: Not on file  Food Insecurity: Not on file  Transportation Needs: Not on file  Physical Activity: Not on file  Stress: Not on file  Social Connections: Not on file   SDOH:  SDOH Screenings   Alcohol Screen: Not on file  Depression (VZD6-3): Not on file  Financial Resource Strain: Not on file  Food Insecurity: Not on file  Housing: Not on file  Physical Activity: Not on file  Social Connections: Not on file  Stress: Not on file  Tobacco Use: Low Risk   . Smoking Tobacco Use: Never Smoker  . Smokeless Tobacco Use: Never Used  Transportation Needs: Not on file  Has this patient used any form of tobacco in the last 30 days? (Cigarettes, Smokeless Tobacco, Cigars, and/or Pipes) Prescription not provided because: n/a  Current Medications:  Current Facility-Administered Medications  Medication Dose Route Frequency Provider Last Rate Last Admin  . acetaminophen (TYLENOL) tablet 650 mg  650 mg Oral Q6H PRN Jaclyn Shaggyaylor, Cody W, PA-C      . alum & mag hydroxide-simeth (MAALOX/MYLANTA) 200-200-20 MG/5ML suspension 30 mL  30 mL Oral Q4H PRN Jaclyn Shaggyaylor, Cody W, PA-C      . hydrOXYzine (ATARAX/VISTARIL) tablet 25 mg  25 mg Oral TID PRN Jaclyn Shaggyaylor, Cody W, PA-C      . magnesium hydroxide (MILK OF MAGNESIA) suspension 30 mL  30 mL Oral Daily PRN Melbourne Abtsaylor, Cody W, PA-C      . QUEtiapine (SEROQUEL) tablet 50 mg  50 mg Oral QHS Melbourne Abtsaylor, Cody W, PA-C   50 mg at 12/31/20 0016  . traZODone (DESYREL) tablet 50 mg  50 mg Oral QHS PRN Jaclyn Shaggyaylor, Cody W, PA-C   50 mg at 12/31/20 0016   No current outpatient medications on file.    PTA Medications: (Not in a hospital  admission)   Musculoskeletal  Strength & Muscle Tone: within normal limits Gait & Station: normal Patient leans: N/A  Psychiatric Specialty Exam  Presentation  General Appearance: Appropriate for Environment; Well Groomed  Eye Contact:Good  Speech:Clear and Coherent; Normal Rate  Speech Volume:Normal  Handedness:Right   Mood and Affect  Mood:Anxious (Paranoid)  Affect:Congruent   Thought Process  Thought Processes:Disorganized  Descriptions of Associations:Circumstantial  Orientation:Full (Time, Place and Person)  Thought Content:Paranoid Ideation; Delusions  Hallucinations:Hallucinations: None  Ideas of Reference:Paranoia; Delusions  Suicidal Thoughts:Suicidal Thoughts: No  Homicidal Thoughts:Homicidal Thoughts: No   Sensorium  Memory:Immediate Fair; Recent Fair; Remote Fair  Judgment:Intact  Insight:Lacking   Executive Functions  Concentration:Fair  Attention Span:Fair  Recall:Fair  Fund of Knowledge:Fair  Language:Good   Psychomotor Activity  Psychomotor Activity:Psychomotor Activity: Restlessness   Assets  Assets:Desire for Improvement; Manufacturing systems engineerCommunication Skills; Housing; Leisure Time; Physical Health; Social Support; Transportation   Sleep  Sleep:Sleep: Poor   Physical Exam  Physical Exam Constitutional:      Appearance: Normal appearance. He is normal weight.  HENT:     Head: Normocephalic and atraumatic.  Pulmonary:     Effort: Pulmonary effort is normal.  Neurological:     Mental Status: He is alert.    Review of Systems  Constitutional: Negative for chills and fever.  Psychiatric/Behavioral: Negative for depression and suicidal ideas. The patient is nervous/anxious.    Blood pressure 135/75, pulse 88, temperature 97.8 F (36.6 C), temperature source Tympanic, resp. rate 18, height 5\' 11"  (1.803 m), weight 59 kg, SpO2 100 %. Body mass index is 18.13 kg/m.  Demographic Factors:  Male and Low socioeconomic  status  Loss Factors: Financial problems/change in socioeconomic status  Historical Factors: NA  Risk Reduction Factors:   Sense of responsibility to family and Positive social support  Continued Clinical Symptoms:  Alcohol/Substance Abuse/Dependencies Currently Psychotic  Cognitive Features That Contribute To Risk:  Closed-mindedness    Suicide Risk:  Minimal: No identifiable suicidal ideation.  Patients presenting with no risk factors but with morbid ruminations; may be classified as minimal risk based on the severity of the depressive symptoms  Plan Of Care/Follow-up recommendations:  Transfer to [facility] -will stop scheduled seroquel 50 mg and start risperdal 1 mg BID-previously stabilized on risperdal while at [place] and seen by psychiatry service.   Disposition: transfer to  [facility]  Estella Husk, MD 12/31/2020, 1:14 PM

## 2020-12-31 NOTE — Progress Notes (Signed)
CSW met with patient at bedside at his request. Patient continues to exhibit delusional and disorganized thought, loose associations, saying that he was trying to get away from his "fake friends." It appears, by patient statements, that his "fake friends" are either auditory and/or visual hallucinations. Patient felt the need to tell CSW why he has been observed by nursing staff talking to people while alone. Patient made multiple requests to be discharged, patient was difficult to redirect.    Signed:   W. , MSW, LCSWA, LCASA 12/31/2020 11:43 AM 

## 2021-01-01 DIAGNOSIS — F12959 Cannabis use, unspecified with psychotic disorder, unspecified: Secondary | ICD-10-CM

## 2021-01-01 DIAGNOSIS — F29 Unspecified psychosis not due to a substance or known physiological condition: Secondary | ICD-10-CM

## 2021-01-01 DIAGNOSIS — F209 Schizophrenia, unspecified: Secondary | ICD-10-CM | POA: Diagnosis not present

## 2021-01-01 MED ORDER — WHITE PETROLATUM EX OINT
TOPICAL_OINTMENT | CUTANEOUS | Status: AC
  Filled 2021-01-01: qty 25

## 2021-01-01 MED ORDER — LORAZEPAM 1 MG PO TABS
2.0000 mg | ORAL_TABLET | ORAL | Status: DC | PRN
  Administered 2021-01-02 – 2021-01-06 (×8): 2 mg via ORAL
  Filled 2021-01-01 (×9): qty 2

## 2021-01-01 MED ORDER — WHITE PETROLATUM EX OINT
TOPICAL_OINTMENT | CUTANEOUS | Status: AC
  Filled 2021-01-01: qty 5

## 2021-01-01 NOTE — Progress Notes (Signed)
Recreation Therapy Notes  INPATIENT RECREATION THERAPY ASSESSMENT  Patient Details Name: Jesse Bryan MRN: [id number] DOB: 12-25-1995 Today's Date: 01/01/2021       Information Obtained From: Patient  Able to Participate in Assessment/Interview: Yes  Patient Presentation: Alert (Delusional)  Reason for Admission (Per Patient): Med Non-Compliance,Other (Comments) (Paranoia)  Patient Stressors: Relationship (Pt stated his relationship is stressful because he and his girlfriend can read each other's bodies.  Pt went on to say he knows he will have kids with her and that when he holds her while they are asleep, he knows he will be ok.)  Coping Skills:   Isolation,Journal,Sports,TV,Music,Exercise,Meditate,Deep Breathing,Talk,Art,Prayer,Read  Leisure Interests (2+):  Sports - Football,Sports - Basketball,Sports - Other (Comment) (Boxing; UFC)  Frequency of Recreation/Participation: Weekly  Awareness of Community Resources:  Yes  Community Resources:  Other (Comment) Landscape architect station; Grocery store; Clinical cytogeneticist)  Current Use: Yes  If no, Barriers?:    Expressed Interest in [employer] Information: No  County of Residence:  [place]  Patient Main Form of Transportation: Set designer  Patient Strengths:  Mind; Heart  Patient Identified Areas of Improvement:  Social Distancing  Patient Goal for Hospitalization:  "get myself better"  Current SI (including self-harm):  No  Current HI:  No  Current AVH: No  Staff Intervention Plan: Group Attendance,Collaborate with Interdisciplinary Treatment Team  Consent to Intern Participation: N/A    Caroll Rancher, LRT/CTRS  Caroll Rancher A 01/01/2021, 12:21 PM

## 2021-01-01 NOTE — Progress Notes (Signed)
Progress note    01/01/21 0750  Psych Admission Type (Psych Patients Only)  Admission Status Involuntary  Psychosocial Assessment  Patient Complaints Anxiety;Disorientation;Restlessness  Eye Contact Watchful  Facial Expression Anxious;Pensive  Affect Anxious;Preoccupied  Child psychotherapist Attention-seeking;Dominating;Hypervigilant;Intrusive  Motor Activity Fidgety  Appearance/Hygiene Unremarkable  Behavior Characteristics Cooperative;Anxious;Fidgety;Impulsive  Mood Anxious;Suspicious;Apprehensive;Preoccupied;Pleasant  Thought Process  Coherency Disorganized;Flight of ideas;Loose associations;Tangential  Content Delusions;Preoccupation;Religiosity;Paranoia  Delusions Grandeur;Paranoid;Religious  Perception Derealization  Hallucination None reported or observed  Judgment Impaired  Confusion Mild  Danger to Self  Current suicidal ideation? Denies  Danger to Others  Danger to Others None reported or observed

## 2021-01-01 NOTE — Progress Notes (Signed)
D: Patient present with blunted affect and is very suspicious and paranoid upon assessment. Patient is slightly intrusive in the milieu and requires redirection of behavior. Upon conversation patient is tangential and makes loose associations along with delusions of grandeur and paranoid and persecutory delusions. Patient denies SI/HI at this time. Patient also denies AH/VH at this time and does not appear to be responding to internal stimuli. Patient contracts for safety.  A: Provided positive reinforcement and encouragement. Provided redirection of behavior.  R: Patient cooperative and receptive to efforts after redirection of behavior. Patient remains safe on the unit.   12/31/20 2115  Psych Admission Type (Psych Patients Only)  Admission Status Involuntary  Psychosocial Assessment  Patient Complaints Suspiciousness  Eye Contact Watchful;Glaring  Facial Expression Anxious;Animated  Affect Blunted  Speech Argumentative;Tangential  Interaction Guarded;Assertive  Motor Activity Slow  Appearance/Hygiene Unremarkable  Behavior Characteristics Guarded  Mood Suspicious;Preoccupied  Thought Process  Coherency Tangential;Loose associations  Content Delusions;Preoccupation;Paranoia  Delusions Paranoid;Grandeur;Persecutory  Perception Derealization  Hallucination None reported or observed  Judgment Impaired  Confusion None  Danger to Self  Current suicidal ideation? Denies  Danger to Others  Danger to Others None reported or observed

## 2021-01-01 NOTE — [facility] Suicide Risk Assessment (Signed)
[facility] Admission Suicide Risk Assessment   Nursing information obtained from:  Patient Demographic factors:  Adolescent or young adult Current Mental Status:  NA Loss Factors:  NA Historical Factors:  NA Risk Reduction Factors:  Positive social support  Total Time spent with patient: 20 minutes Principal Problem: Psychosis (HCC) Diagnosis:  Principal Problem:   Psychosis (HCC) Active Problems:   Cannabis use with psychotic disorder (HCC)   Schizophrenia spectrum disorder with psychotic disorder type not yet determined (HCC)  Subjective Data: 25 year old male with no previous personal psych history but a family history significant for schizophrenia presenting in a psychotic state.   Continued Clinical Symptoms:  Alcohol Use Disorder Identification Test Final Score (AUDIT): 0 The "Alcohol Use Disorders Identification Test", Guidelines for Use in Primary Care, Second Edition.  World Science writer [facility]). Score between 0-7:  no or low risk or alcohol related problems. Score between 8-15:  moderate risk of alcohol related problems. Score between 16-19:  high risk of alcohol related problems. Score 20 or above:  warrants further diagnostic evaluation for alcohol dependence and treatment.   CLINICAL FACTORS:   Currently Psychotic   COGNITIVE FEATURES THAT CONTRIBUTE TO RISK:  Loss of executive function    SUICIDE RISK:   Moderate:  Frequent suicidal ideation with limited intensity, and duration, some specificity in terms of plans, no associated intent, good self-control, limited dysphoria/symptomatology, some risk factors present, and identifiable protective factors, including available and accessible social support.  PLAN OF CARE: Continue care on inpatient unit  I certify that inpatient services furnished can reasonably be expected to improve the patient's condition.   Clement Sayres, MD 01/01/2021, 12:00 PM

## 2021-01-01 NOTE — Plan of Care (Signed)
  Problem: Education: Goal: Knowledge of  General Education information/materials will improve Outcome: Progressing Goal: Emotional status will improve Outcome: Progressing Goal: Mental status will improve Outcome: Progressing Goal: Verbalization of understanding the information provided will improve Outcome: Progressing   
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
## 2021-01-01 NOTE — [facility] Counselor (Signed)
Adult Comprehensive Assessment  Patient ID: Jesse Bryan, male   DOB: 1996/09/29, 25 y.o.   MRN: [id number]  Information Source: Information source: Patient  Current Stressors:  Patient states their primary concerns and needs for treatment are:: "Worrying about my wife" Patient states their goals for this hospitilization and ongoing recovery are:: "To not worry" Educational / Learning stressors: Denies stressor Employment / Job issues: Currently unemployed Family Relationships: Denies Metallurgist / Lack of resources (include bankruptcy): Denies stressor. States he has his significant other are a team Housing / Lack of housing: Denies stressor Physical health (include injuries & life threatening diseases): "No, my heart is beating, my brain is beating. I'm healthy" Social relationships: "You have to watch what you put on social media" Substance abuse: Denies stressor Bereavement / Loss: Denies stressor  Living/Environment/Situation:  Living Arrangements: Spouse/significant other Living conditions (as described by patient or guardian): UTA Who else lives in the home?: "Loved one" How long has patient lived in current situation?: "A few months" Patient whispered this and stated it was on the down low What is atmosphere in current home: Comfortable  Family History:  Marital status: Long term relationship Long term relationship, how long?: 9 What types of issues is patient dealing with in the relationship?: None, patient states that his s/o is a "Pwerful minded woman" States she is able to read his energy and knows who he has been around based on the energy he carries with thim Additional relationship information: n/a Are you sexually active?: Yes What is your sexual orientation?: Heterosexual Has your sexual activity been affected by drugs, alcohol, medication, or emotional stress?: denies Does patient have children?: No  Childhood History:  By whom was/is the patient  raised?: Both parents Additional childhood history information: "Good to my knowledge" Description of patient's relationship with caregiver when they were a child: "Good. Dad used to cook [place] breakfast and my mom was always worried about me because I'm the baby" Patient's description of current relationship with people who raised him/her: Father passed away in 03/09/17. States when he is not around his parents he still hears them talking to him How were you disciplined when you got in trouble as a child/adolescent?: Whooped Does patient have siblings?: Yes Description of patient's current relationship with siblings: "A lot" States he is close with them spiritually Did patient suffer any verbal/emotional/physical/sexual abuse as a child?: No Did patient suffer from severe childhood neglect?: No Has patient ever been sexually abused/assaulted/raped as an adolescent or adult?: No Was the patient ever a victim of a crime or a disaster?: No Witnessed domestic violence?: No Has patient been affected by domestic violence as an adult?: No  Education:  Highest grade of school patient has completed: Some college Currently a Consulting civil engineer?: No Learning disability?: No  Employment/Work Situation:   Employment situation: Unemployed Patient's job has been impacted by current illness: No What is the longest time patient has a held a job?: States he has tried to work in the past, but has never had a job Where was the patient employed at that time?: n/a Has patient ever been in the [employer]?: No  Financial Resources:   Financial resources: Income from spouse,Support from parents / caregiver Does patient have a representative payee or guardian?: No  Alcohol/Substance Abuse:   What has been your use of drugs/alcohol within the last 12 months?: States he has been using THC and recently took a trip to [place] for the [facility]. States he drinks occassionally If attempted  suicide, did drugs/alcohol play a role in this?:  No Alcohol/Substance Abuse Treatment Hx: Denies past history Has alcohol/substance abuse ever caused legal problems?: No  Social Support System:   Patient's Community Support System: Good Describe Community Support System: Significant other Type of faith/religion: Christian How does patient's faith help to cope with current illness?: "Find out the truth and breath it in and out"  Leisure/Recreation:   Do You Have Hobbies?: Yes Leisure and Hobbies: Basketball  Strengths/Needs:   What is the patient's perception of their strengths?: "My mind, heart, and body" Patient states they can use these personal strengths during their treatment to contribute to their recovery: n/a Patient states these barriers may affect/interfere with their treatment: none Patient states these barriers may affect their return to the community: none Other important information patient would like considered in planning for their treatment: none  Discharge Plan:   Currently receiving community mental health services: No Patient states concerns and preferences for aftercare planning are: Patient is open to therapy and medication management at discharge Patient states they will know when they are safe and ready for discharge when: Yes Does patient have access to transportation?: Yes Does patient have financial barriers related to discharge medications?: Yes Patient description of barriers related to discharge medications: no income and no insurance Will patient be returning to same living situation after discharge?: Yes  Summary/Recommendations:   Summary and Recommendations (to be completed by the evaluator): Patient is a 25 year old male presenting voluntarily to [facility] for assessment. Patient is accompanied by his mother, Bonita Quin, girlfriend, Dondra Prader, and mother in Social worker, [place]. Bethann Berkshire joins patient for assessment at his request. Collateral obtained from rest of family after assessment. Patient is a poor historian due  to AMS. Patient endorses paranoid delusions that people are tracking him through social media and cell phones. He also repeatedly expresses paranoia that his family may not be safe. Patient was seen at [facility] on 1/25 with a similar presentation after ingesting THC. He denies any recent substance use. He denies SI/HI/AVH. Patient is preoccupied with the idea of "negativity" and repeatedly states he needs to separate himself from it. Patient is looking around the room and consistently wanting to know who is out in the hallway and the rest of the building. While here, Janzen Kluender can benefit from crisis stabilization, medication management, therapeutic milieu, and referrals for services.  Korine Winton A Lizzie An. 01/01/2021

## 2021-01-01 NOTE — Progress Notes (Signed)
Recreation Therapy Notes  Date: 2.15.22 Time: 0950 Location: 500 Hall Dayroom   Group Topic: Leisure Education  Goal Area(s) Addresses:  Patient will identify positive leisure activities for use post discharge. Patient will identify at skills used to complete activity. Patient will work effectively work with peers to reach shared goal.  Research scientist (physical sciences) Response: Engaged  Intervention: [employer], Archivist, music  Activity: Keep It Contractor.  In a circle, patients will toss a beach ball back and forth to each other.  Patients are to keep the ball in rotation without it coming to a full stop.  LRT will keep count of the number hits made on the ball.  Patients can bounce the ball off the floor or wall as long as it does not stop.  If the ball stops, the count will start over from the beginning.  Education:  Leisure Education, Special educational needs teacher, Physiological scientist, Building control surveyor  Education Outcome: Acknowledges education/In group clarification offered/Needs additional education.   Clinical Observations/Feedback: Pt was engaged but appeared to be talking to himself at times during group.  Pt also interacted well with peers.  Pt had to be reminded a few times about the rules to the game.  Pt left early and did not return.    Caroll Rancher, LRT/CTRS         Caroll Rancher A 01/01/2021 11:57 AM

## 2021-01-02 DIAGNOSIS — F209 Schizophrenia, unspecified: Secondary | ICD-10-CM | POA: Diagnosis not present

## 2021-01-02 MED ORDER — RISPERIDONE 1 MG PO TBDP
1.0000 mg | ORAL_TABLET | Freq: Once | ORAL | Status: AC
  Administered 2021-01-02: 1 mg via ORAL
  Filled 2021-01-02 (×2): qty 1

## 2021-01-02 MED ORDER — RISPERIDONE 2 MG PO TBDP
4.0000 mg | ORAL_TABLET | Freq: Every day | ORAL | Status: DC
  Administered 2021-01-02 – 2021-01-04 (×3): 4 mg via ORAL
  Filled 2021-01-02 (×6): qty 2

## 2021-01-02 MED ORDER — RISPERIDONE 3 MG PO TBDP
3.0000 mg | ORAL_TABLET | Freq: Every day | ORAL | Status: DC
  Administered 2021-01-03 – 2021-01-06 (×5): 3 mg via ORAL
  Filled 2021-01-02 (×6): qty 1

## 2021-01-02 NOTE — Progress Notes (Signed)
[facility] MD Progress Note  01/02/2021 11:33 AM Jesse Bryan  MRN:  [id number] Subjective:  This AM patient appears sitting comfortably on the bench in his room eating his cheerios. Patient reports that he did not sleep well last night because he worried that patient's may come into his room. Patient reports that he is also still weary of "negative energies" and reports that he will take the bible and push away at the air at his feet if he feels the negative energy.  Patient reports that he also still very worried that believes that people are trying to "steal my stuff and ideas." Patient reports that he believes that the people who may be doing this are people he has known for a while. Otherwise patient does not endorse SI, HI, nor VH. Patient does endorse AH and reports that he "sometimes" hears people talking about him. Patient endorses that he has not had visual hallucinations since he was at his mother's house a week ago.   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 No pertinent past medical history. History reviewed. No pertinent surgical history. Family History: History reviewed. No pertinent family history. Family Psychiatric  History: See H&P Social History:  Social History   Substance and Sexual Activity  Alcohol Use Not Currently   Comment: socially     Social History   Substance and Sexual Activity  Drug Use Not Currently  . Types: Marijuana   Comment: reports no use since [facility] visit    Social History   Socioeconomic History  . Marital status: Single    Spouse name: Not on file  . Number of children: Not on file  . Years of education: Not on file  . Highest  education level: Not on file  Occupational History  . Not on file  Tobacco Use  . Smoking status: Never Smoker  . Smokeless tobacco: Never Used  Vaping Use  . Vaping Use: Former  Substance and Sexual Activity  . Alcohol use: Not Currently    Comment: socially  . Drug use: Not Currently    Types: Marijuana    Comment: reports no use since [facility] visit  . Sexual activity: Yes  Other Topics Concern  . Not on file  Social History Narrative  . Not on file   Social Determinants of Health   Financial Resource Strain: Not on file  Food Insecurity: Not on file  Transportation Needs: Not on file  Physical Activity: Not on file  Stress: Not on file  Social Connections: Not on file   Additional Social History:                         Sleep: Poor  Appetite:  Fair  Current Medications: Current Facility-Administered Medications  Medication Dose Route Frequency Provider Last Rate Last Admin  . acetaminophen (TYLENOL) tablet 650 mg  650 mg Oral Q6H PRN Antonieta Pert, MD      . alum & mag hydroxide-simeth (MAALOX/MYLANTA) 200-200-20 MG/5ML suspension 30 mL  30 mL Oral Q4H PRN Antonieta Pert, MD      . hydrOXYzine (ATARAX/VISTARIL) tablet 25 mg  25 mg Oral TID PRN Antonieta Pert, MD   25  mg at 01/02/21 0726  . risperiDONE (RISPERDAL M-TABS) disintegrating tablet 2 mg  2 mg Oral Q8H PRN Antonieta Pert, MD       And  . LORazepam (ATIVAN) tablet 1 mg  1 mg Oral Q6H PRN Antonieta Pert, MD       And  . ziprasidone (GEODON) injection 20 mg  20 mg Intramuscular Q6H PRN Antonieta Pert, MD      . LORazepam (ATIVAN) tablet 2 mg  2 mg Oral Q4H PRN Bobbye Morton, MD   2 mg at 01/02/21 0726  . magnesium hydroxide (MILK OF MAGNESIA) suspension 30 mL  30 mL Oral Daily PRN Antonieta Pert, MD      . Melene Muller ON 01/03/2021] risperiDONE (RISPERDAL M-TABS) disintegrating tablet 3 mg  3 mg Oral Daily Brenisha Tsui B, MD      . risperiDONE (RISPERDAL M-TABS) disintegrating  tablet 4 mg  4 mg Oral QHS Aaima Gaddie B, MD      . traZODone (DESYREL) tablet 50 mg  50 mg Oral QHS PRN Antonieta Pert, MD   50 mg at 01/01/21 2056    Lab Results: No results found for this or any previous visit (from the past 48 hour(s)).  Blood Alcohol level:  Lab Results  Component Value Date   ETH <10 12/11/2020    Metabolic Disorder Labs: Lab Results  Component Value Date   HGBA1C 5.5 12/30/2020   MPG 111.15 12/30/2020   No results found for: PROLACTIN Lab Results  Component Value Date   CHOL 214 (H) 12/30/2020   TRIG 80 12/30/2020   HDL 47 12/30/2020   CHOLHDL 4.6 12/30/2020   VLDL 16 12/30/2020   LDLCALC 151 (H) 12/30/2020    Physical Findings: AIMS: Facial and Oral Movements Muscles of Facial Expression: None, normal Lips and Perioral Area: None, normal Jaw: None, normal Tongue: None, normal,Extremity Movements Upper (arms, wrists, hands, fingers): None, normal Lower (legs, knees, ankles, toes): None, normal, Trunk Movements Neck, shoulders, hips: None, normal, Overall Severity Severity of abnormal movements (highest score from questions above): None, normal Incapacitation due to abnormal movements: None, normal Patient's awareness of abnormal movements (rate only patient's report): No Awareness, Dental Status Current problems with teeth and/or dentures?: No Does patient usually wear dentures?: No  CIWA:    COWS:     Musculoskeletal: Strength & Muscle Tone: within normal limits Gait & Station: normal Patient leans: N/A  Psychiatric Specialty Exam: Physical Exam Constitutional:      Appearance: Normal appearance.  HENT:     Head: Normocephalic and atraumatic.  Pulmonary:     Effort: Pulmonary effort is normal.  Neurological:     Mental Status: He is alert.     Review of Systems  Cardiovascular: Negative for chest pain.  Gastrointestinal: Negative for abdominal pain.  Neurological: Negative for headaches.    Blood pressure 116/82, pulse  (!) 105, temperature 98.3 F (36.8 C), temperature source Oral, resp. rate 18, height 5\' 9"  (1.753 m), weight 59 kg, SpO2 100 %.Body mass index is 19.2 kg/m.  General Appearance: Casual patient did pick up the bible at the end of our conversation and he had finished his breakfast. Patient forehead did not appear red this AM.  Eye Contact:  Minimal  Speech:  Clear and Coherent  Volume:  Normal  Mood:  Anxious  Affect:  Flat  Thought Process:  Goal Directed  Orientation:  NA  Thought Content:  Illogical, Delusions and Hallucinations: Visual  Suicidal  Thoughts:  No  Homicidal Thoughts:  No  Memory:  Recent;   Fair  Judgement:  Impaired  Insight:  Lacking patient knows that he has been different since he came back from [place].  Psychomotor Activity:  Normal  Concentration:  Concentration: Slight imporvments today, less disorganized today   Recall:  NA  Fund of Knowledge:  NA  Language:  Good  Akathisia:  No  Handed:  Right  AIMS (if indicated):     Assets:  Communication Skills Housing Intimacy Physical Health Resilience Social Support  ADL's:  Intact  Cognition:  WNL  Sleep:  Number of Hours: 3.5     Treatment Plan Summary: Daily contact with patient to assess and evaluate symptoms and progress in treatment Mr. Huq is a 25 yo patient who prior to 2022 had no known psychiatric hx. Patient appeared to have psychotic features after smoking a new strain of THC.  On exam today patient is less disorganized in his thought, but patient is still delusional and paranoid. Patient also reports poor sleep 2/2 to his paranoia. Will increase patient's risperdal. Patient appears to have better concentration today and has less flight of ideas as well. Patient continues to be concerned about "negative energy attaching" to him. Patient did not endorse paranoia towards staff today which is an improvement in his level of paranoia. Patient has been interacting well on the unit thus far.   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 not yet determine Cannabis use disorder - Risperdal 3mg  daily - Risperdal 4mg  QHS Agitation Protocol: Risperdal 2mg , q 8h, Ativan 1mg  q6h, Geodon 20mg   - Ativan 2mg  q4h  PRN -Tylenol 650mg  q6h, pain -Maalox 31ml q4h, indigestion -Atarax 25mg  TID, anxiety -Milk of Mag 13mL, constipation -Trazodone 50mg  QHS, insomnia   PGY-1 , MD 01/02/2021, 11:33 AM

## 2021-01-02 NOTE — [facility] Suicide Risk Assessment (Signed)
[facility] INPATIENT:  Family/Significant Other Suicide Prevention Education   Suicide Prevention Education: Education Completed; mother Ladavion Savitz [phone]), has been identified by the patient as the family member/significant other with whom the patient will be residing, and identified as the person(s) who will aid the patient in the event of a mental health crisis (suicidal ideations/suicide attempt).  With written consent from the patient, the family member/significant other has been provided the following suicide prevention education, prior to the and/or following the discharge of the patient.  The suicide prevention education provided includes the following:  Suicide risk factors  Suicide prevention and interventions  National Suicide Hotline telephone number  [facility] assessment telephone number  [facility] Emergency Assistance 911  [facility] and/or Residential Mobile Crisis Unit telephone number   Request made of family/significant other to:  Remove weapons (e.g., guns, rifles, knives), all items previously/currently identified as safety concern.    Remove drugs/medications (over-the-counter, prescriptions, illicit drugs), all items previously/currently identified as a safety concern.   The family member/significant other verbalizes understanding of the suicide prevention education information provided.  The family member/significant other agrees to remove the items of safety concern listed above.  CSW spoke with this patients mother who stated that this patient took a trip to [place] for his birthday on 12/04[birth year]20. She stated that on their return trip 12/08/20 that his friends and siblings began to notice he was acting strange. She stated on this trip that he smoked the strongest strain of weed.  She noticed on 1/25 that he was paranoid when he came to her house and would not allow them to watch TV or be on their phone. She said he began checking the blinds  and continued to act strange. A few days later he was at home and wanted to put locks on their closet doors and security lights outside.  Per mother patient is able to return to his apartment when he discharges from [facility] or if needed he is able to stay with her for the time being.   Ruthann Cancer MSW, LCSW Clincal Social Worker  [facility]

## 2021-01-02 NOTE — Progress Notes (Signed)
Adult Psychoeducational Group Note  Date:  01/02/2021 Time:  12:24 AM  Group Topic/Focus:  Wrap-Up Group:   The focus of this group is to help patients review their daily goal of treatment and discuss progress on daily workbooks.  Participation Level:  Active  Participation Quality:  Appropriate  Affect:  Anxious and Excited  Cognitive:  Disorganized, Confused and Delusional  Insight: Lacking and Limited  Engagement in Group:  Lacking, Limited, Off Topic and Poor  Modes of Intervention:  Discussion  Additional Comments:  Pt stated his goal for today was to focus on his treatment plan. Pt stated he accomplished his goal today. Pt stated he did not know if he talk with his doctor or social worker about his care today. Pt rated his overall day a 5 out of 10. Pt stated he kept seeing red and blue today. Writer asked pt to elaborate. Pt stated red and blue makes purple. Pt stated that was all.  Pt stated his relationship with his family and support system needs to be improved. Pt state he did not remember if he made any calls to his family or support team today. Pt stated he was able to attend all meals. Pt stated he took all medications provided today. Pt stated he felt better about himself today. Pt stated his appetite was pretty good  today. Pt rated sleep last night was pretty good. Pt stated the goal for tonight was to get some rest. Pt stated he was no physical pain today. Pt deny auditory or visual hallucinations. Pt denies thoughts of harming himself or others. Pt stated he would alert staff if anything changes.  Felipa Furnace 01/02/2021, 12:24 AM

## 2021-01-02 NOTE — Progress Notes (Signed)
Adult Psychoeducational Group Note  Date:  01/02/2021 Time:  11:26 PM  Group Topic/Focus:  Wrap-Up Group:   The focus of this group is to help patients review their daily goal of treatment and discuss progress on daily workbooks.  Participation Level:  Active  Participation Quality:  Appropriate  Affect:  Appropriate  Cognitive:  Disorganized  Insight: Limited  Engagement in Group:  Limited  Modes of Intervention:  Discussion  Additional Comments:   Pt stated his goal for today was to focus on his treatment plan. Pt stated he accomplished his goal today. Pt stated he talked with his doctor and social worker about his care today. Pt rated his overall day a 10.  Pt stated his relationship with his family and support system needs to be improved. Pt stated he contacted his wife today which improved his day. Pt stated he was able to attend all meals. Pt stated he took all medications provided today. Pt stated he felt better about himself today. Pt stated his appetite was pretty good  today. Pt rated sleep last night was pretty good. Pt stated the goal for tonight was to get some rest. Pt stated he was no physical pain today. Pt deny auditory or visual hallucinations. Pt denies thoughts of harming himself or others. Pt stated he would alert staff if anything changes.  Felipa Furnace 01/02/2021, 11:26 PM

## 2021-01-02 NOTE — Progress Notes (Signed)
Pt reported that he did not sleep well last night because his door kept opening every 15 minutes  Because MHT's were doing their safety checks.  Pt will keep the door of his room ajar tonight to see if that will help him sleep better so the door will not fully open and close q 15 min. Pt reported  that his appetite was good. Pt said overall he felt "fantastic".  Pt denies SI/HI/AVH.  Pt's speech is somewhat pressured and thoughts can be disorganized at times.  RN provided support and assessed for needs and concerns.  Pt remains safe on the unit with q 15 min checks in place.

## 2021-01-02 NOTE — [facility] Group Notes (Signed)
[facility] LCSW Group Therapy  01/02/2021 2:08 PM  Type of Therapy:  Movement Therapy  Participation Level:  Active  Participation Quality:  Appropriate  Engagement in Therapy:  Engaged  Modes of Intervention:  Activity and Socialization  Summary of Progress/Problems: Patient attended and participated in group. Patient was observed to be interacting with peers and was appropriate throughout group.   Sharyl Nimrod A Yamilee Harmes 01/02/2021, 2:08 PM

## 2021-01-02 NOTE — Progress Notes (Signed)
Recreation Therapy Notes  Date: 2.16.22 Time: 1010 Location: [address] Dayroom  Group Topic: Anxiety  Goal Area(s) Addresses:  Patient will identify what anxiety is. Patient will identify what causes anxiety. Patient will identify coping skills for dealing with anxiety.  Behavioral Response: Engaged  Intervention: Worksheet  Activity: Introduction to Anxiety.  Patient will identify triggers to anxiety, physical symptoms experienced when anxious, thoughts that go through the mind when anxious and coping skills used to deal with anxiety.  Education: Anxiety, Discharge Planning  Education Outcome: Acknowledges understanding/In group clarification offered/Needs additional education.   Clinical Observations/Feedback: Pt came to group late.  Pt was appropriate during session.  Pt stated his triggers were not knowing who's really there for him and talking to them/them not trying to take from him.  Pt physical symptoms were identified as his chest and legs hurting.  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  Coping skills pt identified when anxious are breathing exercises and doing the [employer] taunt.     Caroll Rancher, LRT/CTRS     Caroll Rancher A 01/02/2021 11:29 AM

## 2021-01-02 NOTE — Plan of Care (Signed)
  Problem: Coping: Goal: Coping ability will improve Outcome: Progressing Goal: Will verbalize feelings Outcome: Progressing   Problem: Health Behavior/Discharge Planning: Goal: Compliance with prescribed medication regimen will improve Outcome: Progressing   Problem: Nutritional: Goal: Ability to achieve adequate nutritional intake will improve Outcome: Progressing

## 2021-01-02 NOTE — Tx Team (Signed)
Interdisciplinary Treatment and Diagnostic Plan Update  01/02/2021 Time of Session: 9:50am Jesse Bryan MRN: [id number]  Principal Diagnosis: Psychosis [facility])  Secondary Diagnoses: Principal Problem:   Psychosis ([place]) Active Problems:   Cannabis use with psychotic disorder ([place])   Schizophrenia spectrum disorder with psychotic disorder type not yet determined ([place])   Current Medications:  Current Facility-Administered Medications  Medication Dose Route Frequency Provider Last Rate Last Admin  . acetaminophen (TYLENOL) tablet 650 mg  650 mg Oral Q6H PRN Sharma Covert, MD      . alum & mag hydroxide-simeth (MAALOX/MYLANTA) 200-200-20 MG/5ML suspension 30 mL  30 mL Oral Q4H PRN Sharma Covert, MD      . hydrOXYzine (ATARAX/VISTARIL) tablet 25 mg  25 mg Oral TID PRN Sharma Covert, MD   25 mg at 01/02/21 0726  . risperiDONE (RISPERDAL M-TABS) disintegrating tablet 2 mg  2 mg Oral Q8H PRN Sharma Covert, MD       And  . LORazepam (ATIVAN) tablet 1 mg  1 mg Oral Q6H PRN Sharma Covert, MD       And  . ziprasidone (GEODON) injection 20 mg  20 mg Intramuscular Q6H PRN Sharma Covert, MD      . LORazepam (ATIVAN) tablet 2 mg  2 mg Oral Q4H PRN Freida Busman, MD   2 mg at 01/02/21 0726  . magnesium hydroxide (MILK OF MAGNESIA) suspension 30 mL  30 mL Oral Daily PRN Sharma Covert, MD      . Derrill Memo ON 01/03/2021] risperiDONE (RISPERDAL M-TABS) disintegrating tablet 3 mg  3 mg Oral Daily McQuilla, Jai B, MD      . risperiDONE (RISPERDAL M-TABS) disintegrating tablet 4 mg  4 mg Oral QHS McQuilla, Jai B, MD      . traZODone (DESYREL) tablet 50 mg  50 mg Oral QHS PRN Sharma Covert, MD   50 mg at 01/01/21 2056   PTA Medications: No medications prior to admission.    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   Physician Treatment Plan for Primary Diagnosis: Psychosis ([place]) Long Term Goal(s): Improvement in symptoms so as ready for discharge Improvement in symptoms so as ready for discharge   Short Term Goals: Ability to identify changes in lifestyle to reduce recurrence of condition will improve Ability to verbalize feelings will improve Ability to demonstrate self-control will improve Ability to identify triggers associated with substance abuse/mental health issues will improve Ability to identify changes in lifestyle to reduce recurrence of condition will improve Ability to verbalize feelings will improve Compliance with prescribed medications will improve Ability to identify triggers associated with substance abuse/mental health issues will improve  Medication Management: Evaluate patient's response, side effects, and tolerance of medication regimen.  Therapeutic Interventions: 1 to 1 sessions, Unit Group sessions and Medication administration.  Evaluation of Outcomes: Not Met  Physician Treatment Plan for Secondary Diagnosis: Principal Problem:   Psychosis ([place]) Active Problems:   Cannabis use with psychotic disorder ([place])   Schizophrenia spectrum disorder with psychotic disorder type not yet determined ([place])  Long Term Goal(s): Improvement in symptoms so as ready for discharge Improvement in symptoms so as ready for discharge   Short Term Goals: Ability to identify changes in lifestyle to reduce recurrence of condition will improve Ability to verbalize feelings will improve  Ability to demonstrate self-control will improve Ability to identify triggers associated with substance abuse/mental health issues will improve Ability to identify changes in lifestyle to reduce recurrence of condition will improve Ability to verbalize feelings will improve Compliance with prescribed medications  will improve Ability to identify triggers associated with substance abuse/mental health issues will improve     Medication Management: Evaluate patient's response, side effects, and tolerance of medication regimen.  Therapeutic Interventions: 1 to 1 sessions, Unit Group sessions and Medication administration.  Evaluation of Outcomes: Not Met   RN Treatment Plan for Primary Diagnosis: Psychosis ([place]) Long Term Goal(s): Knowledge of disease and therapeutic regimen to maintain health will improve  Short Term Goals: Ability to demonstrate self-control, Ability to participate in decision making will improve and Ability to verbalize feelings will improve  Medication Management: RN will administer medications as ordered by provider, will assess and evaluate patient's response and provide education to patient for prescribed medication. RN will report any adverse and/or side effects to prescribing provider.  Therapeutic Interventions: 1 on 1 counseling sessions, Psychoeducation, Medication administration, Evaluate responses to treatment, Monitor vital signs and CBGs as ordered, Perform/monitor CIWA, COWS, AIMS and Fall Risk screenings as ordered, Perform wound care treatments as ordered.  Evaluation of Outcomes: Not Met   LCSW Treatment Plan for Primary Diagnosis: Psychosis ([place]) Long Term Goal(s): Safe transition to appropriate next level of care at discharge, Engage patient in therapeutic group addressing interpersonal concerns.  Short Term Goals: Engage patient in aftercare planning with referrals and resources, Increase social support and Increase ability to appropriately verbalize feelings  Therapeutic Interventions: Assess for all discharge needs, 1 to 1 time with Social worker, Explore available resources and support systems, Assess for adequacy in community support network, Educate family and significant other(s) on suicide prevention, Complete Psychosocial Assessment, Interpersonal group  therapy.  Evaluation of Outcomes: Not Met   Progress in Treatment: Attending groups: Yes. Participating in groups: Yes. Taking medication as prescribed: Yes. Toleration medication: Yes. Family/Significant other contact made: No, will contact:  mother Patient understands diagnosis: No. Discussing patient identified problems/goals with staff: No. Medical problems stabilized or resolved: Yes. Denies suicidal/homicidal ideation: Yes. Issues/concerns per patient self-inventory: No. Other: None  New problem(s) identified: No, Describe:  CSW will continue to assess  New Short Term/Long Term Goal(s): medication stabilization, elimination of SI thoughts, development of comprehensive mental wellness plan.  Patient Goals:  Pt was asleep.  Discharge Plan or Barriers: Patient recently admitted. CSW will continue to follow and assess for appropriate referrals and possible discharge planning.  Reason for Continuation of Hospitalization: Hallucinations Mania Medication stabilization  Estimated Length of Stay: 3-5 days  Attendees: Patient: Pt did not attend 01/02/2021   Physician: Dr. Claris Gower 01/02/2021   Nursing:  01/02/2021   RN Care Manager: 01/02/2021   Social Worker: Toney Reil, [place] 01/02/2021   Recreational Therapist:  01/02/2021   Other:  01/02/2021   Other:  01/02/2021   Other: 01/02/2021       Scribe for Treatment Team: Mliss Fritz, [employer] 01/02/2021 11:11 AM

## 2021-01-03 DIAGNOSIS — F209 Schizophrenia, unspecified: Secondary | ICD-10-CM | POA: Diagnosis not present

## 2021-01-03 MED ORDER — WHITE PETROLATUM EX OINT
TOPICAL_OINTMENT | CUTANEOUS | Status: AC
  Filled 2021-01-03: qty 5

## 2021-01-03 NOTE — [facility] Group Notes (Signed)
Adult Psychoeducational Group Note  Date:  01/03/2021 Time:  9:35 PM  Group Topic/Focus:  Wrap-Up Group:   The focus of this group is to help patients review their daily goal of treatment and discuss progress on daily workbooks.  Participation Level:  Active  Participation Quality:  Attentive  Affect:  Appropriate  Cognitive:  Appropriate  Insight: Improving  Engagement in Group:  Improving  Modes of Intervention:  Discussion  Additional Comments  Jacalyn Lefevre 01/03/2021, 9:35 PM

## 2021-01-03 NOTE — Progress Notes (Signed)
Recreation Therapy Notes  Date: 2.17.22 Time: 0945 Location: [address] Dayroom   Group Topic: Self Esteem    Goal Area(s) Addresses:  Patient will appropriately identify what self esteem is.  Patient will create a shield of armor describing themselves.  Patient will successfully identify positive attributes about themselves.  Patient will acknowledge benefit of improved self-esteem.    Behavioral Response: Engaged  Intervention / Activity: Self-Esteem Shield.  LRT gave each patient a blank shield divided into four quadrants.Patient was asked to fill in the shield to show off their unique attributes, four quadrants reflected the following:  The Upper Left quadrant- two things or people you value The Upper Right quadrant- two lessons you've learned in life thus far The Lower Left quadrant- three things that make you unique The Lower Right quadrant- one goal you would like to work towards    Patients were provided sheets with the shield printed on them and markers.  Education: Self esteem, Communication, Positive self-talk, Discharge Planning   Education Outcome: Acknowledges education/Verbalizes understanding of Education/In group clarification offered/Needs further education   Comments: Pt arrived to group late but joined right in with the activity.  Pt was bright and active during group.  In the first quadrant, pt stated M & D are the people closed to him; in the second quadrant pt expressed everything in life happened twice, he sees things as "me vs me" and everyone is not who they say they are: in quadrant three pt identified himself, mom and his heart and in quadrant four pt stated he wants to work on his communication skills because he has always been quiet and felt bottled in.  Pt also expressed he enjoyed the group and that talking helped learn things.     Caroll Rancher, LRT/CTRS   Lillia Abed, [place] A 01/03/2021 11:06 AM

## 2021-01-03 NOTE — [facility] Group Notes (Signed)
Occupational Therapy Group Note Date: 01/03/2021 Group Topic/Focus: Socialization/Social Skills  Group Description: Group encouraged increased participation and engagement through discussion focused on STRENGTHS. Patients were encouraged to fill out a worksheet to structure discussion, that included questions such as things I am good at, compliments I have received, what I like about my appearance, challenges I have overcome, I have helped others by, things that make me unique, what I value the most, and times I have made others happy. Discussion followed with patients sharing their responses and highlighting their own personal strengths.  Therapeutic Goals: Identify strengths vs weaknesses Discuss and identify ways we can highlight our strengths Participation Level: Active   Participation Quality: Minimal Cues   Behavior: Calm and Poor boundaries   Speech/Thought Process: Disorganized   Affect/Mood: Constricted   Insight: Impaired and Poor   Judgement: Impaired and Poor   Individualization: Jesse Bryan was active in his participation of discussion, however difficult to redirect, poor boundaries, and interrupting other peers mid-sentence. Pt identified his strengths as "sports, basketball and football."   Modes of Intervention: Activity, Discussion, Socialization and Support  Patient Response to Interventions:  Attentive   Plan: Continue to engage patient in OT groups 2 - 3x/week.   01/03/2021  Donne Hazel, MOT, OTR/L

## 2021-01-03 NOTE — Progress Notes (Signed)
[facility] MD Progress Note  01/03/2021 3:00 PM Jesse Bryan  MRN:  [id number] Subjective:  Patient reports that he id sleep better last night and that he felt safer last night. Patient reports that he also spoke with his mother yesterday and he the conversation went well. Patient reports that he also spoke with his fiance whom he calls his "wife" [name] and he reported that he wanted to keep it brief. When MD asks patient why he is in the hospital patient has some difficulty answering but does report that he remembers his "wife" crying "last time" he was acting strange and he does not want that to happen again. Patient reports that he is worried about his "wife and kids." MD reminds patient that he does not have children and patient reports he means his "unborn children in his wife." Patient reports that he is worried that when someone leaves the unit they may go and do harm to his family. Patient also reports that he remains very concerned that that someone or people are trying to steal his "ideas" and when MD asks who specifically he is concerned about patient pulls out some papers folded in his pocket. On one of the papers in his pocket patient has a short list of people who he believes have negative energy including his previous friends and some current staff that patient does continue to get along with. Patient reports that he is also concerned about negative energy coming from a patient who appears to have taken a liking to him. Patient reports that he finds this other patient a bit strange and he makes him nervous, but he appears to have only had good conversations with this patient. Patient denies SI, HI, and AVH today.  Principal Problem: Psychosis ([facility]) Diagnosis: Principal Problem:   Psychosis ([facility]) Active Problems:   Cannabis use with psychotic disorder ([facility])   Schizophrenia spectrum disorder with psychotic disorder type not yet determined ([facility])  Total Time spent with patient: 15  minutes  Past Psychiatric History: See H&P  Past Medical History: History reviewed. No pertinent past medical history. History reviewed. No pertinent surgical history. Family History: History reviewed. No pertinent family history. Family Psychiatric  History: See H&P Social History:  Social History   Substance and Sexual Activity  Alcohol Use Not Currently   Comment: socially     Social History   Substance and Sexual Activity  Drug Use Not Currently  . Types: Marijuana   Comment: reports no use since [facility] visit    Social History   Socioeconomic History  . Marital status: Single    Spouse name: Not on file  . Number of children: Not on file  . Years of education: Not on file  . Highest education level: Not on file  Occupational History  . Not on file  Tobacco Use  . Smoking status: Never Smoker  . Smokeless tobacco: Never Used  Vaping Use  . Vaping Use: Former  Substance and Sexual Activity  . Alcohol use: Not Currently    Comment: socially  . Drug use: Not Currently    Types: Marijuana    Comment: reports no use since [facility] visit  . Sexual activity: Yes  Other Topics Concern  . Not on file  Social History Narrative  . Not on file   Social Determinants of Health   Financial Resource Strain: Not on file  Food Insecurity: Not on file  Transportation Needs: Not on file  Physical Activity: Not on file  Stress:  Not on file  Social Connections: Not on file   Additional Social History:                         Sleep: Poor  Appetite:  Good  Current Medications: Current Facility-Administered Medications  Medication Dose Route Frequency Provider Last Rate Last Admin  . acetaminophen (TYLENOL) tablet 650 mg  650 mg Oral Q6H PRN Antonieta Pert, MD      . alum & mag hydroxide-simeth (MAALOX/MYLANTA) 200-200-20 MG/5ML suspension 30 mL  30 mL Oral Q4H PRN Antonieta Pert, MD      . hydrOXYzine (ATARAX/VISTARIL) tablet 25 mg  25 mg Oral TID PRN Antonieta Pert, MD   25 mg at 01/03/21 0224  . risperiDONE (RISPERDAL M-TABS) disintegrating tablet 2 mg  2 mg Oral Q8H PRN Antonieta Pert, MD       And  . LORazepam (ATIVAN) tablet 1 mg  1 mg Oral Q6H PRN Antonieta Pert, MD       And  . ziprasidone (GEODON) injection 20 mg  20 mg Intramuscular Q6H PRN Antonieta Pert, MD      . LORazepam (ATIVAN) tablet 2 mg  2 mg Oral Q4H PRN Eliseo Gum B, MD   2 mg at 01/02/21 1641  . magnesium hydroxide (MILK OF MAGNESIA) suspension 30 mL  30 mL Oral Daily PRN Antonieta Pert, MD      . risperiDONE (RISPERDAL M-TABS) disintegrating tablet 3 mg  3 mg Oral Daily Eliseo Gum B, MD   3 mg at 01/03/21 4128  . risperiDONE (RISPERDAL M-TABS) disintegrating tablet 4 mg  4 mg Oral QHS Eliseo Gum B, MD   4 mg at 01/02/21 2057  . traZODone (DESYREL) tablet 50 mg  50 mg Oral QHS PRN Antonieta Pert, MD   50 mg at 01/02/21 2057  . white petrolatum (VASELINE) gel             Lab Results: No results found for this or any previous visit (from the past 48 hour(s)).  Blood Alcohol level:  Lab Results  Component Value Date   ETH <10 12/11/2020    Metabolic Disorder Labs: Lab Results  Component Value Date   HGBA1C 5.5 12/30/2020   MPG 111.15 12/30/2020   No results found for: PROLACTIN Lab Results  Component Value Date   CHOL 214 (H) 12/30/2020   TRIG 80 12/30/2020   HDL 47 12/30/2020   CHOLHDL 4.6 12/30/2020   VLDL 16 12/30/2020   LDLCALC 151 (H) 12/30/2020    Physical Findings: AIMS: Facial and Oral Movements Muscles of Facial Expression: None, normal Lips and Perioral Area: None, normal Jaw: None, normal Tongue: None, normal,Extremity Movements Upper (arms, wrists, hands, fingers): None, normal Lower (legs, knees, ankles, toes): None, normal, Trunk Movements Neck, shoulders, hips: None, normal, Overall Severity Severity of abnormal movements (highest score from questions above): None, normal Incapacitation due to abnormal  movements: None, normal Patient's awareness of abnormal movements (rate only patient's report): No Awareness, Dental Status Current problems with teeth and/or dentures?: No Does patient usually wear dentures?: No  CIWA:    COWS:     Musculoskeletal: Strength & Muscle Tone: within normal limits Gait & Station: normal Patient leans: N/A  Psychiatric Specialty Exam: Physical Exam HENT:     Head: Normocephalic and atraumatic.  Pulmonary:     Effort: Pulmonary effort is normal.  Neurological:     Mental Status: He is  alert.     Review of Systems  Cardiovascular: Negative for chest pain.  Gastrointestinal: Negative for abdominal pain.  Neurological: Negative for headaches.    Blood pressure (!) 109/53, pulse (!) 118, temperature 98 F (36.7 C), temperature source Oral, resp. rate 18, height 5\' 9"  (1.753 m), weight 59 kg, SpO2 100 %.Body mass index is 19.2 kg/m.  General Appearance: Casual  Eye Contact:  Good  Speech:  Slow  Volume:  Normal  Mood:  Anxious and Euthymic  Affect:  Flat  Thought Process:  Goal Directed  Orientation:  Full (Time, Place, and Person)  Thought Content:  Paranoid Ideation  Suicidal Thoughts:  No  Homicidal Thoughts:  No  Memory:  Recent;   Fair  Judgement:  Other:  Improving  Insight:  Shallow  Psychomotor Activity:  Psychomotor Retardation  Concentration:  Concentration: Fair  Recall:  NA  Fund of Knowledge:  NA  Language:  Good  Akathisia:  No    AIMS (if indicated):     Assets:  Communication Skills Desire for Improvement Housing Intimacy Leisure Time Physical Health Resilience Social Support  ADL's:  Intact  Cognition:  WNL  Sleep:  Number of Hours: 3.5     Treatment Plan Summary: Daily contact with patient to assess and evaluate symptoms and progress in treatment Mr. Devenport is a 25 yo patient who prior to 2022 had no known psychiatric hx. Patient reports that he slept better last night; however he number of hours for sleep  remains unchanged, will continue to monitor. If patient does not have increased hours slept tonight will have to consider medication adjustment. Patient has shown some improvement in regards to his paranoia and his thought process is much more goal directed than on admission. Patient also appears to be aware that he is not at his baseline, but continues to endorse paranoia and his ideas are illogical. At this time patient continues to be hypereligious but he is seen less often with the bible than on admission and is less preoccupied in conversation by positive and negative energies.   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 not yet determine Cannabis use disorder - Risperdal 3mg  daily - Risperdal 4mg  QHS Agitation Protocol: Risperdal 2mg , q 8h, Ativan 1mg  q6h, Geodon 20mg   - Ativan 2mg  q4h  PRN -Tylenol 650mg  q6h, pain -Maalox 48ml q4h, indigestion -Atarax 25mg  TID, anxiety -Milk of Mag 62mL, constipation -Trazodone 50mg  QHS, insomnia PGY-1 , MD 01/03/2021, 3:00 PM

## 2021-01-03 NOTE — Progress Notes (Signed)
   01/03/21 1600  Psych Admission Type (Psych Patients Only)  Admission Status Involuntary  Psychosocial Assessment  Patient Complaints Suspiciousness  Eye Contact Watchful  Facial Expression Anxious;Pensive;Worried  Affect Anxious;Preoccupied;Threatening  Speech Tangential  Interaction Attention-seeking;Childlike;Demanding;Intrusive  Motor Activity Fidgety;Pacing;Restless  Appearance/Hygiene Unremarkable  Behavior Characteristics Impulsive  Mood Suspicious  Thought Process  Coherency Disorganized;Flight of ideas  Content Delusions;Preoccupation;Religiosity;Paranoia  Delusions Grandeur;Religious  Perception Derealization  Hallucination None reported or observed  Judgment Poor  Confusion Mild  Danger to Self  Current suicidal ideation? Denies  Danger to Others  Danger to Others None reported or observed

## 2021-01-04 DIAGNOSIS — F209 Schizophrenia, unspecified: Secondary | ICD-10-CM | POA: Diagnosis not present

## 2021-01-04 NOTE — Progress Notes (Signed)
Pt attended group and said that his goal is to improve his communication skills. Pt remains intrusive, paranoid, and preoccupied. Pt kept asking for the picture in his chart to be changed. Pt said that he only wanted to take melatonin tonight. Educated pt about his medications and he took them without any problem. He remains very suspicious and paranoid regarding another pt on the unit. Pt said that he was stealing stuff from his room and got up close to this other pt's face. Pt was able to be verbally deescalated. Pt was informed that this is unacceptable behavior and was educated on appropriate behavior for discharge. Reassured pt that we check on all of the pts every 15 minutes and that no such activity has been observed. Pt demonstrated understanding. Pt vomited X 1 after he smelled the bad odor from another pt farting near the dayroom. Pt was provided water and refused the need of any medication. No further episodes of emesis occurred. Pt is currently resting in bed. No distress has been observed.   01/03/21 2045  Psych Admission Type (Psych Patients Only)  Admission Status Involuntary  Psychosocial Assessment  Patient Complaints Anxiety;Restlessness;Suspiciousness;Worrying  Music therapist;Watchful  Facial Expression Anxious;Worried  Affect Anxious;Preoccupied;Labile  Pensions consultant;Tangential  Interaction Assertive;Attention-seeking;Demanding;Intrusive;Needy  Motor Activity Fidgety;Restless  Appearance/Hygiene Unremarkable  Behavior Characteristics Anxious;Fidgety;Impulsive;Intrusive;Restless  Mood Anxious;Suspicious;Preoccupied  Thought Process  Coherency Disorganized;Flight of ideas;Tangential  Content Delusions;Paranoia;Preoccupation;Religiosity  Delusions Paranoid;Religious;Grandeur  Perception Derealization  Hallucination None reported or observed  Judgment Poor  Confusion Mild  Danger to Self  Current suicidal ideation? Denies  Danger to Others  Danger to Others  None reported or observed

## 2021-01-04 NOTE — Progress Notes (Signed)
   01/04/21 2035  COVID-19 Daily Checkoff  Have you had a fever (temp > 37.80C/100F)  in the past 24 hours?  No  If you have had runny nose, nasal congestion, sneezing in the past 24 hours, has it worsened? No  COVID-19 EXPOSURE  Have you traveled outside the state in the past 14 days? No  Have you been in contact with someone with a confirmed diagnosis of COVID-19 or PUI in the past 14 days without wearing appropriate PPE? No  Have you been living in the same home as a person with confirmed diagnosis of COVID-19 or a PUI (household contact)? No  Have you been diagnosed with COVID-19? No

## 2021-01-04 NOTE — Progress Notes (Signed)
Adult Psychoeducational Group Note  Date:  01/04/2021 Time:  11:01 PM  Group Topic/Focus:  Wrap-Up Group:   The focus of this group is to help patients review their daily goal of treatment and discuss progress on daily workbooks.  Participation Level:  Active  Participation Quality:  Appropriate  Affect:  Anxious  Cognitive:  Disorganized and Confused  Insight: Limited  Engagement in Group:  Limited and Off Topic  Modes of Intervention:  Discussion  Additional Comments:  Pt stated his goal for today was to focus on his treatment plan. Pt stated he accomplished his goal today. Pt stated he talked withhis doctor andsocial worker about his care today. Pt rated his overall day a10. Pt stated his relationship with his family and support system needs to be improved. Pt stated he contacted his wife and mother today which improved his day.Pt stated he was able to attend all meals. Pt stated he took all medications provided today. Pt stated he felt better about himself today. Pt stated his appetite was pretty goodtoday. Pt rated sleep last night was pretty good. Pt stated the goal for tonight was to get some rest.Pt stated he was no physical pain today. Pt deny auditory or visual hallucinations. Pt denies thoughts of harming himself or others. Pt stated he would alert staff if anything changes.  Felipa Furnace 01/04/2021, 11:01 PM

## 2021-01-04 NOTE — Progress Notes (Signed)
   01/03/21 2045  COVID-19 Daily Checkoff  Have you had a fever (temp > 37.80C/100F)  in the past 24 hours?  No  COVID-19 EXPOSURE  Have you traveled outside the state in the past 14 days? No  Have you been in contact with someone with a confirmed diagnosis of COVID-19 or PUI in the past 14 days without wearing appropriate PPE? No  Have you been living in the same home as a person with confirmed diagnosis of COVID-19 or a PUI (household contact)? No  Have you been diagnosed with COVID-19? No

## 2021-01-04 NOTE — Progress Notes (Signed)
[facility] MD Progress Note  01/04/2021 12:20 PM Jesse Bryan  MRN:  [id number] Subjective:  On exam today patient appears to be very well rested and also reports that he did sleep well. Patient reports that he is not worried about the safety of his family. Patient reports that he does believe that his mother is worried about him, especially because " I am her youngest." Patient does not endorse being scared on the unit. Patient reports that he has been talking with some of the other patient's and although he finds some of the patient's weirder than others he continues to interact well with them. Patient reports that he wrote a message that could only be read in a mirror and placed it in the name card on his door, unfortunately it is stuck in there. 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 Patient denies SI, HI, nor AVH.  Principal Problem: Psychosis (HCC) Diagnosis: Principal Problem:   Psychosis (HCC) Active Problems:   Cannabis use with psychotic disorder (HCC)   Schizophrenia spectrum disorder with psychotic disorder type not yet determined (HCC)  Total Time spent with patient: 20 minutes  Past Psychiatric History: See H&P  Past Medical History: History reviewed. No pertinent past medical history. History reviewed. No pertinent surgical history. Family History: History reviewed. No pertinent family history. Family Psychiatric  History: See H&P Social History:  Social History   Substance and Sexual Activity  Alcohol Use Not Currently   Comment: socially     Social History   Substance and Sexual Activity  Drug Use Not Currently  . Types: Marijuana   Comment: reports no use since [facility] visit    Social History   Socioeconomic History  . Marital status: Single    Spouse name: Not on file  . Number of children: Not on file  . Years of education: Not on file  . Highest education level: Not on file  Occupational History   . Not on file  Tobacco Use  . Smoking status: Never Smoker  . Smokeless tobacco: Never Used  Vaping Use  . Vaping Use: Former  Substance and Sexual Activity  . Alcohol use: Not Currently    Comment: socially  . Drug use: Not Currently    Types: Marijuana    Comment: reports no use since [facility] visit  . Sexual activity: Yes  Other Topics Concern  . Not on file  Social History Narrative  . Not on file   Social Determinants of Health   Financial Resource Strain: Not on file  Food Insecurity: Not on file  Transportation Needs: Not on file  Physical Activity: Not on file  Stress: Not on file  Social Connections: Not on file   Additional Social History:                         Sleep: Good  Appetite:  Fair  Current Medications: Current Facility-Administered Medications  Medication Dose Route Frequency Provider Last Rate Last Admin  . acetaminophen (TYLENOL) tablet 650 mg  650 mg Oral Q6H PRN Antonieta Pert, MD      . alum & mag hydroxide-simeth (MAALOX/MYLANTA) 200-200-20 MG/5ML suspension 30 mL  30 mL Oral Q4H PRN Antonieta Pert, MD      . hydrOXYzine (ATARAX/VISTARIL) tablet 25 mg  25 mg Oral TID PRN Antonieta Pert, MD   25 mg at 01/04/21 0731  . risperiDONE (  RISPERDAL M-TABS) disintegrating tablet 2 mg  2 mg Oral Q8H PRN Antonieta Pert, MD       And  . LORazepam (ATIVAN) tablet 1 mg  1 mg Oral Q6H PRN Antonieta Pert, MD       And  . ziprasidone (GEODON) injection 20 mg  20 mg Intramuscular Q6H PRN Antonieta Pert, MD      . LORazepam (ATIVAN) tablet 2 mg  2 mg Oral Q4H PRN Bobbye Morton, MD   2 mg at 01/04/21 0731  . magnesium hydroxide (MILK OF MAGNESIA) suspension 30 mL  30 mL Oral Daily PRN Antonieta Pert, MD      . risperiDONE (RISPERDAL M-TABS) disintegrating tablet 3 mg  3 mg Oral Daily Eliseo Gum B, MD   3 mg at 01/04/21 0731  . risperiDONE (RISPERDAL M-TABS) disintegrating tablet 4 mg  4 mg Oral QHS Eliseo Gum B, MD   4  mg at 01/03/21 2044  . traZODone (DESYREL) tablet 50 mg  50 mg Oral QHS PRN Antonieta Pert, MD   50 mg at 01/03/21 2045    Lab Results: No results found for this or any previous visit (from the past 48 hour(s)).  Blood Alcohol level:  Lab Results  Component Value Date   ETH <10 12/11/2020    Metabolic Disorder Labs: Lab Results  Component Value Date   HGBA1C 5.5 12/30/2020   MPG 111.15 12/30/2020   No results found for: PROLACTIN Lab Results  Component Value Date   CHOL 214 (H) 12/30/2020   TRIG 80 12/30/2020   HDL 47 12/30/2020   CHOLHDL 4.6 12/30/2020   VLDL 16 12/30/2020   LDLCALC 151 (H) 12/30/2020    Physical Findings: AIMS: Facial and Oral Movements Muscles of Facial Expression: None, normal Lips and Perioral Area: None, normal Jaw: None, normal Tongue: None, normal,Extremity Movements Upper (arms, wrists, hands, fingers): None, normal Lower (legs, knees, ankles, toes): None, normal, Trunk Movements Neck, shoulders, hips: None, normal, Overall Severity Severity of abnormal movements (highest score from questions above): None, normal Incapacitation due to abnormal movements: None, normal Patient's awareness of abnormal movements (rate only patient's report): No Awareness, Dental Status Current problems with teeth and/or dentures?: No Does patient usually wear dentures?: No  CIWA:    COWS:     Musculoskeletal: Strength & Muscle Tone: within normal limits Gait & Station: normal, but slower Patient leans: N/A  Psychiatric Specialty Exam: Physical Exam HENT:     Head: Normocephalic.  Pulmonary:     Effort: Pulmonary effort is normal.  Neurological:     Mental Status: He is alert.     Review of Systems  Cardiovascular: Negative for chest pain.  Gastrointestinal: Negative for abdominal pain.  Neurological: Negative for headaches.    Blood pressure 115/64, pulse (!) 104, temperature 98.4 F (36.9 C), temperature source Oral, resp. rate 18, height  5\' 9"  (1.753 m), weight 59 kg, SpO2 98 %.Body mass index is 19.2 kg/m.  General Appearance: Casual Patient has sterotypical movements today on exam, he reports that he does these movements because they "help me think." Patient has not portrayed this before today.  Eye Contact:  Good  Speech:  Slow and Slurred  Volume:  Decreased  Mood:  Euthymic  Affect:  Flat  Thought Process:  Goal Directed  Orientation:  NA  Thought Content:  Illogical   Suicidal Thoughts:  No  Homicidal Thoughts:  No  Memory:  Recent;   Improving, recalls that  he was acting bizarrely, but also did not remember who his doctor is today  Judgement:  Other:  Improving, endorses that taking the medications helps him feel better  Insight:  Shallow  Psychomotor Activity:  Psychomotor Retardation  Concentration:  Concentration: Improving, not as poor but still difficult to hold a full conversation that does not have to follow the patient's change in conversation  Recall:  NA  Fund of Knowledge:  Fair  Language:  Fair  Akathisia:  No    AIMS (if indicated):   0  Assets:  Communication Skills Desire for Improvement Housing Intimacy Leisure Time Physical Health Resilience Social Support  ADL's:  Intact  Cognition:  WNL  Sleep:  Number of Hours: 6     Treatment Plan Summary: Daily contact with patient to assess and evaluate symptoms and progress in treatment Jesse Bryan is a 25 yo patient who prior to 2022 had no known psychiatric hx. Patient did not endorse paranoia today and seems to appear more comfortable on the unit as well. Patient does continue to endorse some bizarre thoughts and his memory and concentration still require improvement before it would be safe to discharge patient. Patient was noted have more psychomotor retardation today and sterotypical movements. Will slightly decrease patients antipsychotics to adjust for patient appearing a bit oversedated. Will reassess in the AM. Also spoke with patient's  mother today, who noted that patient has sounded more like himself on the phone and other family also feel that patient is improving and getting close to his baseline.  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 not yet determine Cannabis use disorder - Risperdal3mg  daily - Decrease Risperdalto 3mg  QHS Agitation Protocol: Risperdal 2mg , q 8h, Ativan 1mg  q6h - Ativan 2mg  q4h  PRN -Tylenol 650mg  q6h, pain -Maalox 37ml q4h, indigestion -Atarax 25mg  TID, anxiety -Milk of Mag 63mL, constipation -Trazodone 50mg  QHS, insomnia  PGY-1 , MD 01/04/2021, 12:20 PM

## 2021-01-04 NOTE — Plan of Care (Signed)
  Problem: Safety: Goal: Ability to redirect hostility and anger into socially appropriate behaviors will improve Outcome: Progressing Goal: Ability to remain free from injury will improve Outcome: Progressing   Problem: Self-Care: Goal: Ability to participate in self-care as condition permits will improve Outcome: Progressing   Problem: Self-Concept: Goal: Will verbalize positive feelings about self Outcome: Progressing   

## 2021-01-04 NOTE — Progress Notes (Signed)
Recreation Therapy Notes  Date:  2.18.22 Time: 1005 Location: [address] Day Room  Group Topic: Stress Management  Goal Area(s) Addresses:  Patient will identify positive stress management techniques. Patient will identify benefits of using stress management post d/c.  Behavioral Response: Engaged  Intervention: Stress Management  Activity: Meditation.  LRT played a meditation focused on loving-kindness of self and others.  Patients were to listen and learn to speak positive affirmations over self and others.  Education: Stress Management, Discharge Planning.   Education Outcome: Acknowledges Education  Clinical Observations/Feedback: Pt was quiet and able to focus during group session.  Pt sat with his head down on the table at the front of the room.  Pt was attentive and appropriate during group session.    Caroll Rancher, LRT/CTRS    Lillia Abed, Bettyjo Lundblad A 01/04/2021 11:40 AM

## 2021-01-04 NOTE — Progress Notes (Signed)
Progress note    01/04/21 0730  Psych Admission Type (Psych Patients Only)  Admission Status Involuntary  Psychosocial Assessment  Patient Complaints Anxiety;Confusion;Restlessness  Eye Contact Suspiciousness;Watchful  Facial Expression Anxious;Worried  Affect Anxious;Preoccupied  Pensions consultant;Tangential  Interaction Assertive;Attention-seeking;Demanding;Intrusive  Motor Activity Fidgety;Restless  Appearance/Hygiene Unremarkable  Behavior Characteristics Cooperative;Appropriate to situation;Anxious;Fidgety;Impulsive;Intrusive  Mood Anxious;Suspicious;Preoccupied;Pleasant  Thought Process  Coherency Disorganized;Flight of ideas;Tangential  Content Delusions;Preoccupation;Paranoia  Delusions Grandeur;Paranoid;Religious  Perception Derealization  Hallucination None reported or observed  Judgment Poor  Confusion Mild  Danger to Self  Current suicidal ideation? Denies  Danger to Others  Danger to Others None reported or observed

## 2021-01-04 NOTE — Progress Notes (Signed)
Patient was compliant with hs medication. He remains intrusive, anxious and preoccupied.  01/04/21 2035  Psych Admission Type (Psych Patients Only)  Admission Status Involuntary  Psychosocial Assessment  Patient Complaints Confusion;Anxiety;Restlessness  Eye Contact Suspiciousness;Watchful  Facial Expression Anxious;Worried  Affect Anxious;Preoccupied  Pensions consultant;Tangential  Interaction Assertive;Attention-seeking;Demanding;Intrusive  Motor Activity Fidgety;Restless  Appearance/Hygiene Unremarkable  Behavior Characteristics Appropriate to situation  Thought Process  Coherency Disorganized;Flight of ideas;Tangential  Content Delusions;Preoccupation;Paranoia  Delusions Grandeur;Paranoid;Religious  Perception Derealization  Hallucination None reported or observed  Judgment Poor  Confusion Mild  Danger to Self  Current suicidal ideation? Denies  Danger to Others  Danger to Others None reported or observed

## 2021-01-05 DIAGNOSIS — F29 Unspecified psychosis not due to a substance or known physiological condition: Principal | ICD-10-CM

## 2021-01-05 MED ORDER — RISPERIDONE 3 MG PO TBDP
3.0000 mg | ORAL_TABLET | Freq: Every day | ORAL | Status: DC
  Administered 2021-01-05: 3 mg via ORAL
  Filled 2021-01-05 (×3): qty 1

## 2021-01-05 NOTE — Progress Notes (Signed)
   01/05/21 2134  Psych Admission Type (Psych Patients Only)  Admission Status Involuntary  Psychosocial Assessment  Patient Complaints Anxiety;Worrying  Music therapist;Watchful  Facial Expression Anxious;Worried  Affect Anxious;Preoccupied  Pensions consultant;Tangential  Interaction Assertive;Attention-seeking;Demanding;Intrusive  Motor Activity Fidgety;Restless  Appearance/Hygiene Unremarkable  Behavior Characteristics Cooperative;Appropriate to situation  Mood Anxious;Pleasant  Thought Process  Coherency Disorganized;Flight of ideas;Tangential  Content Delusions;Preoccupation;Paranoia  Delusions Grandeur;Paranoid;Religious  Perception Derealization  Hallucination None reported or observed  Judgment Poor  Confusion Mild  Danger to Self  Current suicidal ideation? Denies  Danger to Others  Danger to Others None reported or observed

## 2021-01-05 NOTE — Progress Notes (Signed)
   01/05/21 2131  COVID-19 Daily Checkoff  Have you had a fever (temp > 37.80C/100F)  in the past 24 hours?  No  If you have had runny nose, nasal congestion, sneezing in the past 24 hours, has it worsened? No  COVID-19 EXPOSURE  Have you traveled outside the state in the past 14 days? No  Have you been in contact with someone with a confirmed diagnosis of COVID-19 or PUI in the past 14 days without wearing appropriate PPE? No  Have you been living in the same home as a person with confirmed diagnosis of COVID-19 or a PUI (household contact)? No  Have you been diagnosed with COVID-19? No

## 2021-01-05 NOTE — Progress Notes (Signed)
[facility] MD Progress Note  01/05/2021 12:22 PM Jesse Bryan  MRN:  [id number] Subjective:  Patient was seen face to face on the unit, chart reviewed and case discussed with the treatment team. Patient is still having some paranoid, disorganized thoughts. 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 He stated he believed people on Instagram were watching him and going to get him but stated he no longer feels that way. Patient is carrying two Bibles with him but does not discuss any religious delusions. No evidence of stereotypical movements today. His thought process is mildly disorganized and his belief is that the negative energy of some people here affects him. He is not perseverating or ruminating on these thoughts. He is easily redirected. He stated he feels better than when he was admitted and he is asking about discharge. Reminded patient that we are working toward that with medication adjustments and aftercare follow up appointments. Patient stated he is "calm, cool and collected."  Patient stated he feels safe on the unit.  Patient reports that he has been talking with some of the other patient's and although he finds some of the patient's weirder than others he continues to interact well with them. Patient reported he threw up in his sink and had to clean it up himself, per staff patient has not vomitted.  He is taking his medications as prescribed and has no complaints of side effects.  Patient denies SI, HI, nor AVH.    Principal Problem: Psychosis (HCC) Diagnosis: Principal Problem:   Psychosis (HCC) Active Problems:   Cannabis use with psychotic disorder (HCC)   Schizophrenia spectrum disorder with psychotic disorder type not yet determined (HCC)  Total Time spent with patient: 20 minutes  Past Psychiatric History: No hx of psychiatrist or therapist prior to 2022. 12/10/2020 patient was seen at the [facility] for psychosis and had to be transferred  to [facility] due to his behavior requiring more acute care. Patient received Risperdal and Zyprexa with Ativan PRN. Patient improved and was sent home with no medications. Patient was also started on Seroquel when he returned to the [facility] 12/30/2020 but this was discontinued after one dose and risperdal was restarted. Patient had a hx of spitting out medications during his first ED visit and M-tabs were preferred.  Past Medical History: History reviewed. No pertinent past medical history. History reviewed. No pertinent surgical history. Family History: History reviewed. No pertinent family history. Family Psychiatric  History: Father served in [place] and he had really bad PTSD. He died 11 years ago of Parkinson's Disease. Patient grew up witnessing some of bad's paranoia 2/2 to PTSD.  Social History:  Social History   Substance and Sexual Activity  Alcohol Use Not Currently   Comment: socially     Social History   Substance and Sexual Activity  Drug Use Not Currently  . Types: Marijuana   Comment: reports no use since [facility] visit    Social History   Socioeconomic History  . Marital status: Single    Spouse name: Not on file  . Number of children: Not on file  . Years of education: Not on file  . Highest education level: Not on file  Occupational History  . Not on file  Tobacco Use  . Smoking status: Never Smoker  . Smokeless tobacco: Never Used  Vaping Use  . Vaping Use: Former  Substance and Sexual Activity  . Alcohol use: Not Currently    Comment:  socially  . Drug use: Not Currently    Types: Marijuana    Comment: reports no use since [facility] visit  . Sexual activity: Yes  Other Topics Concern  . Not on file  Social History Narrative  . Not on file   Social Determinants of Health   Financial Resource Strain: Not on file  Food Insecurity: Not on file  Transportation Needs: Not on file  Physical Activity: Not on file  Stress: Not on file  Social Connections: Not on file    Additional Social History:   Sleep: Good  Appetite:  Fair  Current Medications: Current Facility-Administered Medications  Medication Dose Route Frequency Provider Last Rate Last Admin  . acetaminophen (TYLENOL) tablet 650 mg  650 mg Oral Q6H PRN Antonieta Pert, MD      . alum & mag hydroxide-simeth (MAALOX/MYLANTA) 200-200-20 MG/5ML suspension 30 mL  30 mL Oral Q4H PRN Antonieta Pert, MD      . hydrOXYzine (ATARAX/VISTARIL) tablet 25 mg  25 mg Oral TID PRN Antonieta Pert, MD   25 mg at 01/04/21 0731  . risperiDONE (RISPERDAL M-TABS) disintegrating tablet 2 mg  2 mg Oral Q8H PRN Antonieta Pert, MD       And  . LORazepam (ATIVAN) tablet 1 mg  1 mg Oral Q6H PRN Antonieta Pert, MD       And  . ziprasidone (GEODON) injection 20 mg  20 mg Intramuscular Q6H PRN Antonieta Pert, MD      . LORazepam (ATIVAN) tablet 2 mg  2 mg Oral Q4H PRN Bobbye Morton, MD   2 mg at 01/04/21 2033  . magnesium hydroxide (MILK OF MAGNESIA) suspension 30 mL  30 mL Oral Daily PRN Antonieta Pert, MD      . risperiDONE (RISPERDAL M-TABS) disintegrating tablet 3 mg  3 mg Oral Daily Eliseo Gum B, MD   3 mg at 01/05/21 0912  . risperiDONE (RISPERDAL M-TABS) disintegrating tablet 4 mg  4 mg Oral QHS Eliseo Gum B, MD   4 mg at 01/04/21 2030  . traZODone (DESYREL) tablet 50 mg  50 mg Oral QHS PRN Antonieta Pert, MD   50 mg at 01/03/21 2045    Lab Results: No results found for this or any previous visit (from the past 48 hour(s)).  Blood Alcohol level:  Lab Results  Component Value Date   ETH <10 12/11/2020    Metabolic Disorder Labs: Lab Results  Component Value Date   HGBA1C 5.5 12/30/2020   MPG 111.15 12/30/2020   No results found for: PROLACTIN Lab Results  Component Value Date   CHOL 214 (H) 12/30/2020   TRIG 80 12/30/2020   HDL 47 12/30/2020   CHOLHDL 4.6 12/30/2020   VLDL 16 12/30/2020   LDLCALC 151 (H) 12/30/2020    Physical Findings: AIMS: Facial and  Oral Movements Muscles of Facial Expression: None, normal Lips and Perioral Area: None, normal Jaw: None, normal Tongue: None, normal,Extremity Movements Upper (arms, wrists, hands, fingers): None, normal Lower (legs, knees, ankles, toes): None, normal, Trunk Movements Neck, shoulders, hips: None, normal, Overall Severity Severity of abnormal movements (highest score from questions above): None, normal Incapacitation due to abnormal movements: None, normal Patient's awareness of abnormal movements (rate only patient's report): No Awareness, Dental Status Current problems with teeth and/or dentures?: No Does patient usually wear dentures?: No  CIWA:    COWS:     Musculoskeletal: Strength & Muscle Tone: within normal limits Gait &  Station: normal, but slower Patient leans: N/A  Psychiatric Specialty Exam: Physical Exam HENT:     Head: Normocephalic.  Pulmonary:     Effort: Pulmonary effort is normal.  Musculoskeletal:        General: Normal range of motion.     Cervical back: Normal range of motion.  Neurological:     General: No focal deficit present.     Mental Status: He is alert and oriented to person, place, and time.  Psychiatric:        Attention and Perception: Attention normal.        Speech: Speech normal.        Behavior: Behavior is cooperative.        Thought Content: Thought content is not paranoid or delusional.     Review of Systems  HENT: Negative for rhinorrhea and sneezing.   Respiratory: Negative for cough and shortness of breath.   Cardiovascular: Negative for chest pain.  Gastrointestinal: Negative for abdominal pain.  Neurological: Negative for headaches.    Blood pressure 128/76, pulse (!) 107, temperature 98 F (36.7 C), temperature source Oral, resp. rate 18, height 5\' 9"  (1.753 m), weight 59 kg, SpO2 100 %.Body mass index is 19.2 kg/m.  General Appearance: Casual, dressed in street clothes, adequate hygiene.   Eye Contact:  Good  Speech:   Slow, Slurred and Improving  Volume:  Decreased  Mood:  Euthymic  Affect:  Flat  Thought Process:  Goal Directed, focused on discharge  Orientation:  Full (Time, Place, and Person)  Thought Content:  Illogical, paranoid and delusional thought processes are improving daily    Suicidal Thoughts:  No  Homicidal Thoughts:  No  Memory:  Recent;   Improving, recalls that he was acting bizarrely and believed people on social media were watching him  Judgement:  Other:  Improving, endorses that taking the medications helps him feel better  Insight:  Shallow  Psychomotor Activity:  Psychomotor Retardation  Concentration:  Concentration: Improving, not as poor but still difficult to hold a full conversation but is easily redirected back on topic.   Recall:  of Knowledge:  Fair  Language:  Fair  Akathisia:  No    AIMS (if indicated):   0  Assets:  Communication Skills Desire for Improvement Housing Intimacy Leisure Time Physical Health Resilience Social Support  ADL's:  Intact  Cognition:  WNL  Sleep:  Number of Hours: 5.75     Treatment Plan Summary: Daily contact with patient to assess and evaluate symptoms and progress in treatment Mr. Olesky is a 25 yo patient who prior to 2022 had no known psychiatric hx. Patient did not endorse paranoia today and seems to appear more comfortable on the unit as well. Patient does continue to endorse some bizarre thoughts and his memory and concentration still require improvement before it would be safe to discharge patient. Patient was noted have more psychomotor retardation today and sterotypical movements. Risperdal 4 mg at bedtime decreased to 3 mg  to adjust for patient appearing a bit oversedated. Will reassess in the AM. Also spoke with patient's mother today, who noted that patient has sounded more like himself on the phone and other family also feel that patient is improving and getting close to his baseline.  Schizophrenia  Spectrum disorder w/ psychotic disorder type not yet determine Cannabis use disorder:   - RisperdalM-tabs 3mg  daily for psychosis - Decrease RisperdalM-tabs to 3mg  QHS, starting 01/05/2021 Agitation Protocol: Risperdal 2mg , q 8h,  Ativan 1mg  q6h - Ativan 2mg  q4h PRN for agitation  PRN -Tylenol 650mg  q6h PRN , pain -Maalox 30ml q4h, indigestion -Atarax 25mg  PRN TID, anxiety -Milk of Mag 30mL, PRN constipation -Trazodone 50mg  QHS PRN, insomnia  PGY-1  Laveda AbbeLaurie Britton Marcus Groll, NP 01/05/2021, 12:22 PM

## 2021-01-05 NOTE — [facility] Group Notes (Signed)
LCSW Group Therapy Note  01/05/2021   10:00-11:00am   Type of Therapy and Topic:  Group Therapy: Anger Cues and Responses  Participation Level:  Active   Description of Group:   In this group, patients learned how to recognize the physical, cognitive, emotional, and behavioral responses they have to anger-provoking situations.  They identified a recent time they became angry and how they reacted.  They analyzed how their reaction was possibly beneficial and how it was possibly unhelpful.  The group discussed a variety of healthier coping skills that could help with such a situation in the future.  Focus was placed on how helpful it is to recognize the underlying emotions to our anger, because working on those can lead to a more permanent solution as well as our ability to focus on the important rather than the urgent.  Therapeutic Goals: 1. Patients will remember their last incident of anger and how they felt emotionally and physically, what their thoughts were at the time, and how they behaved. 2. Patients will identify how their behavior at that time worked for them, as well as how it worked against them. 3. Patients will explore possible new behaviors to use in future anger situations. 4. Patients will learn that anger itself is normal and cannot be eliminated, and that healthier reactions can assist with resolving conflict rather than worsening situations.  Summary of Patient Progress:  The patient was provided with the following information:  . That anger is a natural part of human life.  . That people can acquire effective coping skills and work toward having positive outcomes.  . The patient now understands that there emotional and physical cues associated with anger and that these can be used as warning signs alert them to step-back, regroup and use a coping skill.  . Patient was encouraged to work on managing anger more effectively.  Therapeutic Modalities:   Cognitive Behavioral  Therapy  Mariell Nester D Wendell Nicoson    

## 2021-01-05 NOTE — Progress Notes (Signed)
   01/05/21 1400  Psych Admission Type (Psych Patients Only)  Admission Status Involuntary  Psychosocial Assessment  Patient Complaints Anxiety  Eye Contact Suspiciousness;Watchful  Facial Expression Anxious;Worried  Affect Anxious;Preoccupied  Pensions consultant;Tangential  Interaction Assertive;Attention-seeking;Demanding;Intrusive  Motor Activity Fidgety;Restless  Appearance/Hygiene Unremarkable  Behavior Characteristics Appropriate to situation  Mood Suspicious;Preoccupied  Thought Process  Coherency Disorganized;Flight of ideas;Tangential  Content Delusions;Preoccupation;Paranoia  Delusions Grandeur;Paranoid;Religious  Perception Derealization  Hallucination None reported or observed  Judgment Poor  Confusion Mild  Danger to Self  Current suicidal ideation? Denies  Danger to Others  Danger to Others None reported or observed

## 2021-01-05 NOTE — [facility] Group Notes (Signed)
Psychoeducational Group Note  Date: 01-05-2021 Time: 0900-1000    Goal Setting   Purpose of Group: This group helps to provide patients with the steps of setting a goal that is specific, measurable, attainable, realistic and time specific. A discussion on how we keep ourselves stuck with negative self talk.    Participation Level:  Active  Participation Quality:  Appropriate  Affect:  Appropriate  Cognitive:  Appropriate  Insight:  Improving  Engagement in Group:  Engaged  Additional Comments:  .Wrote out a list of positives about himself and was able to read it aloud to the group. Received positive feed back from the group  Vira Blanco A

## 2021-01-06 MED ORDER — RISPERIDONE 3 MG PO TABS
3.0000 mg | ORAL_TABLET | Freq: Two times a day (BID) | ORAL | Status: DC
  Administered 2021-01-06 – 2021-01-07 (×2): 3 mg via ORAL
  Filled 2021-01-06 (×5): qty 1

## 2021-01-06 MED ORDER — HYDROXYZINE HCL 25 MG PO TABS
25.0000 mg | ORAL_TABLET | Freq: Four times a day (QID) | ORAL | Status: DC | PRN
  Administered 2021-01-06 – 2021-01-13 (×5): 25 mg via ORAL
  Filled 2021-01-06 (×3): qty 1
  Filled 2021-01-06: qty 10
  Filled 2021-01-06 (×2): qty 1
  Filled 2021-01-06: qty 10
  Filled 2021-01-06: qty 1

## 2021-01-06 MED ORDER — HYDROXYZINE HCL 25 MG PO TABS: 25.0000 mg | ORAL_TABLET | Freq: Four times a day (QID) | ORAL | Status: DC | PRN

## 2021-01-06 NOTE — Progress Notes (Signed)
   01/06/21 2339  Psych Admission Type (Psych Patients Only)  Admission Status Involuntary  Psychosocial Assessment  Patient Complaints Anxiety  Eye Contact Suspiciousness;Watchful  Facial Expression Anxious;Worried  Affect Anxious;Preoccupied  Pensions consultant;Tangential  Interaction Assertive;Attention-seeking;Demanding;Intrusive  Motor Activity Fidgety;Restless  Appearance/Hygiene Unremarkable  Behavior Characteristics Cooperative  Mood Anxious  Thought Process  Coherency Disorganized;Flight of ideas;Tangential  Content Delusions;Preoccupation;Paranoia  Delusions Grandeur;Paranoid;Religious  Perception Derealization  Hallucination None reported or observed  Judgment Poor  Confusion Mild  Danger to Self  Current suicidal ideation? Denies  Danger to Others  Danger to Others None reported or observed  D: Patient out in the hallway eating on approach. Pt appears calm and was able to response to writer appropriately. Pt stated he is tolerating medication well.  A: Medications administered as prescribed. Support and encouragement provided as needed.  R: Patient remains safe on the unit. Will continue to monitor for safety and stability.

## 2021-01-06 NOTE — [facility] Group Notes (Signed)
Adult Psychoeducational Group Not Date:  01/06/2021 Time:  4599-7741 Group Topic/Focus: PROGRESSIVE RELAXATION. A group where deep breathing is taught and tensing and relaxation muscle groups is used. Imagery is used as well.  Pts are asked to imagine 3 pillars that hold them up when they are not able to hold themselves up.  Participation Level:  Was in the room but was coloring a picture  Participation Quality: inAppropriate  Affect:  Appropriate  Cognitive:  Oriented  Insight: lacking  Engagement in Group:  Not engaged  Modes of Intervention:  Activity, Discussion, Education, and Support  Additional Comments:  Pt was not connected as to what was going on in the group. Was there but did not participate. Pt stated his support system is and what holds him up is the social media and his friends  Dione Housekeeper

## 2021-01-06 NOTE — Progress Notes (Signed)
[facility] MD Progress Note  01/06/2021 5:06 PM Jesse Bryan  MRN:  [id number] Subjective:  Patient reports that he is doing well this AM and is proud to reports that he slept 7.5 h last night. Patient reports that he is not worried about people stealing his ideas off instagram, but he is worried about his mother. Patient reports that his mother may start drinking "too much" worrying about him. Patient continues to carry his bible, but when asked to talk about what he is carrying he does not mention the bible and is more excited to talk about his drawing and shows his logo. Patient is also able to put all of his items in his room before going to lunch and out for groups. Patient does report that he is still worried someone might steal the papers in his folder when he leaves it in his room, so he hides them under the mattress, but otherwise he reports that he attends group. Patient has been noted to interact well with others on the unit. Patient denies SI, HI, and AVH.   Of note there has been a patient that patient has noted makes him more anxious to be around, but patient has been pleasant with this person. Recently this patient was taken off the unit for medical eval and patient did not reports feelings of anxiety towards other patients  while the patient has been gone.   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 No pertinent past medical history. History reviewed. No pertinent surgical history. Family History: History reviewed. No pertinent family history. Family Psychiatric  History: See H&P Social History:  Social History   Substance and Sexual Activity  Alcohol Use Not Currently    Comment: socially     Social History   Substance and Sexual Activity  Drug Use Not Currently  . Types: Marijuana   Comment: reports no use since [facility] visit    Social History   Socioeconomic History  . Marital status: Single    Spouse name: Not on file  . Number of children: Not on file  . Years of education: Not on file  . Highest education level: Not on file  Occupational History  . Not on file  Tobacco Use  . Smoking status: Never Smoker  . Smokeless tobacco: Never Used  Vaping Use  . Vaping Use: Former  Substance and Sexual Activity  . Alcohol use: Not Currently    Comment: socially  . Drug use: Not Currently    Types: Marijuana    Comment: reports no use since [facility] visit  . Sexual activity: Yes  Other Topics Concern  . Not on file  Social History Narrative  . Not on file   Social Determinants of Health   Financial Resource Strain: Not on file  Food Insecurity: Not on file  Transportation Needs: Not on file  Physical Activity: Not on file  Stress: Not on file  Social Connections: Not on file   Additional Social History:                         Sleep: Good  Appetite:  Good  Current Medications: Current Facility-Administered Medications  Medication Dose Route Frequency Provider  Last Rate Last Admin  . acetaminophen (TYLENOL) tablet 650 mg  650 mg Oral Q6H PRN Antonieta Pert, MD      . alum & mag hydroxide-simeth (MAALOX/MYLANTA) 200-200-20 MG/5ML suspension 30 mL  30 mL Oral Q4H PRN Antonieta Pert, MD      . hydrOXYzine (ATARAX/VISTARIL) tablet 25 mg  25 mg Oral TID PRN Antonieta Pert, MD   25 mg at 01/04/21 0731  . risperiDONE (RISPERDAL M-TABS) disintegrating tablet 2 mg  2 mg Oral Q8H PRN Antonieta Pert, MD       And  . LORazepam (ATIVAN) tablet 1 mg  1 mg Oral Q6H PRN Antonieta Pert, MD       And  . ziprasidone (GEODON) injection 20 mg  20 mg Intramuscular Q6H PRN Antonieta Pert, MD      . magnesium hydroxide (MILK OF  MAGNESIA) suspension 30 mL  30 mL Oral Daily PRN Antonieta Pert, MD      . risperiDONE (RISPERDAL M-TABS) disintegrating tablet 3 mg  3 mg Oral Daily Eliseo Gum B, MD   3 mg at 01/06/21 0725  . risperiDONE (RISPERDAL M-TABS) disintegrating tablet 3 mg  3 mg Oral QHS Laveda Abbe, NP   3 mg at 01/05/21 2124  . traZODone (DESYREL) tablet 50 mg  50 mg Oral QHS PRN Antonieta Pert, MD   50 mg at 01/03/21 2045    Lab Results: No results found for this or any previous visit (from the past 48 hour(s)).  Blood Alcohol level:  Lab Results  Component Value Date   ETH <10 12/11/2020    Metabolic Disorder Labs: Lab Results  Component Value Date   HGBA1C 5.5 12/30/2020   MPG 111.15 12/30/2020   No results found for: PROLACTIN Lab Results  Component Value Date   CHOL 214 (H) 12/30/2020   TRIG 80 12/30/2020   HDL 47 12/30/2020   CHOLHDL 4.6 12/30/2020   VLDL 16 12/30/2020   LDLCALC 151 (H) 12/30/2020    Physical Findings: AIMS: Facial and Oral Movements Muscles of Facial Expression: None, normal Lips and Perioral Area: None, normal Jaw: None, normal Tongue: None, normal,Extremity Movements Upper (arms, wrists, hands, fingers): None, normal Lower (legs, knees, ankles, toes): None, normal, Trunk Movements Neck, shoulders, hips: None, normal, Overall Severity Severity of abnormal movements (highest score from questions above): None, normal Incapacitation due to abnormal movements: None, normal Patient's awareness of abnormal movements (rate only patient's report): No Awareness, Dental Status Current problems with teeth and/or dentures?: No Does patient usually wear dentures?: No  CIWA:    COWS:     Musculoskeletal: Strength & Muscle Tone: within normal limits Gait & Station: normal Patient leans: N/A  Psychiatric Specialty Exam: Physical Exam HENT:     Head: Normocephalic.  Pulmonary:     Effort: Pulmonary effort is normal.  Neurological:     Mental  Status: He is alert.     Review of Systems  Cardiovascular: Negative for chest pain.  Gastrointestinal: Negative for abdominal pain.  Neurological: Negative for headaches.    Blood pressure 107/67, pulse 98, temperature 97.7 F (36.5 C), temperature source Oral, resp. rate 18, height 5\' 9"  (1.753 m), weight 59 kg, SpO2 100 %.Body mass index is 19.2 kg/m.  General Appearance: Casual  Eye Contact:  Good  Speech:  Slow  Volume:  Decreased  Mood:  Euthymic  Affect:  Flat  Thought Process:  Goal Directed  Orientation:  NA  Thought  Content:  Logical  Suicidal Thoughts:  No  Homicidal Thoughts:  No  Memory:  Recent;   Fair  Judgement:  Other:  Improving  Insight:  Shallow  Psychomotor Activity:  Decreased  Concentration:  Concentration: Fair  Recall:  NA  Fund of Knowledge:  Fair  Language:  Fair  Akathisia:  No  Handed:  Right  AIMS (if indicated):     Assets:  Communication Skills Desire for Improvement Housing Intimacy Leisure Time Physical Health Resilience Social Support  ADL's:  Intact  Cognition:  WNL  Sleep:  Number of Hours: 7.25     Treatment Plan Summary: Daily contact with patient to assess and evaluate symptoms and progress in treatment Mr. Harada is a 25 yo patient who prior to 2022 had no known psychiatric hx.On exam today patient continues to appear less sedated and is interacting well on the unit. Patient is seen in the dayroom and attending groups. Patient continues to walk around with a bible and does appear to have psychomotor slowing, but patient slept a sufficient number of hours last night for the first time .Patient did not appear to have stereotypical movements on exam today and was less preoccupied with religion and is endorsing a more goal oriented thought.  Patient's paranoia continues to decrease. Patient does endorse being worried that someone may still his things but this is not completely bizarre as patient appears to only be concerned about  this when he leaves things in his room unattended and he cannot lock the door. However, patient does continue to have some strange thoughts as he is specifically worried that someone may steal his page of phone numbers, but does not have a reason as to why someone would want this information.  Will continue patient at his current dose and continue to monitor. Will transition patient to tablet form of his medication in preparation for discharge, but will monitor patient as he has a hx of non compliance while in the ED.   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 not yet determine Cannabis use disorder:   - Risperdal 3mg  BID for psychosis - Decrease RisperdalM-tabs to 3mg  QHS, starting 01/05/2021 Agitation Protocol: Risperdal 2mg , q 8h, Ativan 1mg  q6h - Ativan 2mg  q4h PRN for agitation  PRN -Tylenol 650mg  q6h PRN , pain -Maalox 62ml q4h, indigestion -Atarax 25mg  PRN TID, anxiety -Milk of Mag 78mL, PRN constipation -Trazodone 50mg  QHS PRN, insomnia  PGY-1 , MD 01/06/2021, 5:06 PM

## 2021-01-06 NOTE — Progress Notes (Signed)
Pt rates his depression, anxiety and hopelessness all 0/10. Reports he slept well last night with good appetite. Observed to be restless / anxious and intrusive on interactions. Goal for today is "Going to see my family". Compliant with medications when offered.  All medications given as ordered with verbal education and effects monitored. Q 15 minutes safety checks maintained without self harm gestures or outburst. Support, encouragement and reassurance offered to pt. Pt continues to require verbal redirections for intrusive behavior. However, he's been cooperative with care. Attended scheduled groups when prompted. Remains safe on and off unit. Tolerated all meals and medications well without discomfort.

## 2021-01-06 NOTE — Progress Notes (Signed)
Adult Psychoeducational Group Note  Date:  01/06/2021 Time:  11:44 PM  Group Topic/Focus:  Wrap-Up Group:   The focus of this group is to help patients review their daily goal of treatment and discuss progress on daily workbooks.  Participation Level:  Active  Participation Quality:  Appropriate  Affect:  Appropriate  Cognitive:  Appropriate  Insight: Appropriate  Engagement in Group:  Developing/Improving  Modes of Intervention:  Discussion  Additional Comments:  Pt stated his goal for today was to focus on his treatment plan and talk with his doctor about his discharge plan. Pt stated he accomplished his goals today. Pt stated he hopes to discharge on Monday (01/07/2021). Pt statedhe talkedwithhis doctorandsocial worker about his care today. Pt rated his overall day a10. Pt stated his relationship with his family and support system needs to be improved. Pt stated he contacted his wife and mother today which improved his day.Pt stated he was able to attend all meals. Pt stated he took all medications provided today. Pt stated he felt better about himself today. Pt stated his appetite was pretty goodtoday. Pt rated sleep last night was pretty good. Pt stated the goal for tonight was to get some rest.Pt stated he was no physical pain today. Pt deny auditory or visual hallucinations. Pt denies thoughts of harming himself or others. Pt stated he would alert staff if anything changes.  Felipa Furnace 01/06/2021, 11:44 PM

## 2021-01-06 NOTE — [facility] Group Notes (Addendum)
[facility] LCSW Group Therapy Note  Date/Time:  01/06/2021  11:00AM-12:00PM  Type of Therapy and Topic:  Group Therapy:  Music and Mood  Participation Level:  Active   Description of Group: In this process group, members listened to a variety of genres of music and identified that different types of music evoke different responses.  Patients were encouraged to identify music that was soothing for them and music that was energizing for them.  Patients discussed how this knowledge can help with wellness and recovery in various ways including managing depression and anxiety as well as encouraging healthy sleep habits.    Therapeutic Goals: 1. Patients will explore the impact of different varieties of music on mood 2. Patients will verbalize the thoughts they have when listening to different types of music 3. Patients will identify music that is soothing to them as well as music that is energizing to them 4. Patients will discuss how to use this knowledge to assist in maintaining wellness and recovery 5. Patients will explore the use of music as a coping skill  Summary of Patient Progress:  At the beginning of group, patient expressed a love of music  At the end of group, patient expressed the same.    Therapeutic Modalities: Solution Focused Brief Therapy Activity   Henrene Dodge, LCSW

## 2021-01-07 DIAGNOSIS — F209 Schizophrenia, unspecified: Secondary | ICD-10-CM | POA: Diagnosis not present

## 2021-01-07 MED ORDER — RISPERIDONE 2 MG PO TABS
4.0000 mg | ORAL_TABLET | Freq: Two times a day (BID) | ORAL | Status: DC
  Administered 2021-01-07 – 2021-01-09 (×4): 4 mg via ORAL
  Filled 2021-01-07 (×8): qty 2

## 2021-01-07 NOTE — Tx Team (Signed)
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 acetaminophen (TYLENOL) tablet 650 mg  650 mg Oral Q6H PRN Antonieta Pert, MD      . alum & mag hydroxide-simeth (MAALOX/MYLANTA) 200-200-20 MG/5ML suspension 30 mL  30 mL Oral Q4H PRN Antonieta Pert, MD      . hydrOXYzine (ATARAX/VISTARIL) tablet 25 mg  25 mg Oral Q6H PRN Bobbye Morton, MD   25 mg at 01/06/21 2158  . risperiDONE (RISPERDAL M-TABS) disintegrating tablet 2 mg  2 mg Oral Q8H PRN Antonieta Pert, MD       And  . LORazepam (ATIVAN) tablet 1 mg  1 mg Oral Q6H PRN Antonieta Pert, MD       And  . ziprasidone (GEODON) injection 20 mg  20 mg Intramuscular Q6H PRN Antonieta Pert, MD      . magnesium hydroxide (MILK OF MAGNESIA) suspension 30 mL  30 mL Oral Daily PRN Antonieta Pert, MD      . risperiDONE (RISPERDAL) tablet 3 mg  3 mg Oral BID Eliseo Gum B, MD   3 mg at 01/07/21 0741  . traZODone (DESYREL) tablet 50 mg  50 mg Oral QHS PRN Antonieta Pert, MD   50 mg at 01/06/21 2158   PTA Medications: No medications prior to admission.    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   Physician Treatment Plan for Primary Diagnosis: Psychosis (HCC) Long Term Goal(s): Improvement in symptoms so as ready for  discharge Improvement in symptoms so as ready for discharge   Short Term Goals: Ability to identify changes in lifestyle to reduce recurrence of condition will improve Ability to verbalize feelings will improve Ability to demonstrate self-control will improve Ability to identify triggers associated with substance abuse/mental health issues will improve Ability to identify changes in lifestyle to reduce recurrence of condition will improve Ability to verbalize feelings will improve Compliance with prescribed medications will improve Ability to identify triggers associated with substance abuse/mental health issues will improve  Medication Management: Evaluate patient's response, side effects, and tolerance of medication regimen.  Therapeutic Interventions: 1 to 1 sessions, Unit Group sessions and Medication administration.  Evaluation of Outcomes: Progressing  Physician Treatment Plan for Secondary Diagnosis: Principal Problem:   Psychosis (HCC) Active Problems:   Cannabis use with psychotic disorder (HCC)   Schizophrenia spectrum disorder with psychotic disorder type not yet determined (HCC)  Long Term Goal(s): Improvement in symptoms so as ready for discharge Improvement in symptoms so as ready for discharge   Short Term Goals: Ability to identify changes in lifestyle to reduce recurrence of condition will improve Ability to verbalize feelings will improve Ability to demonstrate self-control will improve Ability to identify triggers associated with substance abuse/mental health issues will improve Ability to identify changes in lifestyle to reduce recurrence of condition will improve Ability to verbalize feelings will improve Compliance with prescribed medications will improve Ability to identify triggers associated with  substance abuse/mental health issues will improve     Medication Management: Evaluate patient's response, side effects, and tolerance of medication  regimen.  Therapeutic Interventions: 1 to 1 sessions, Unit Group sessions and Medication administration.  Evaluation of Outcomes: Progressing   RN Treatment Plan for Primary Diagnosis: Psychosis (HCC) Long Term Goal(s): Knowledge of disease and therapeutic regimen to maintain health will improve  Short Term Goals: Ability to demonstrate self-control, Ability to participate in decision making will improve and Ability to verbalize feelings will improve  Medication Management: RN will administer medications as ordered by provider, will assess and evaluate patient's response and provide education to patient for prescribed medication. RN will report any adverse and/or side effects to prescribing provider.  Therapeutic Interventions: 1 on 1 counseling sessions, Psychoeducation, Medication administration, Evaluate responses to treatment, Monitor vital signs and CBGs as ordered, Perform/monitor CIWA, COWS, AIMS and Fall Risk screenings as ordered, Perform wound care treatments as ordered.  Evaluation of Outcomes: Progressing   LCSW Treatment Plan for Primary Diagnosis: Psychosis (HCC) Long Term Goal(s): Safe transition to appropriate next level of care at discharge, Engage patient in therapeutic group addressing interpersonal concerns.  Short Term Goals: Engage patient in aftercare planning with referrals and resources, Increase social support and Increase ability to appropriately verbalize feelings  Therapeutic Interventions: Assess for all discharge needs, 1 to 1 time with Social worker, Explore available resources and support systems, Assess for adequacy in community support network, Educate family and significant other(s) on suicide prevention, Complete Psychosocial Assessment, Interpersonal group therapy.  Evaluation of Outcomes: Progressing   Progress in Treatment: Attending groups: Yes. Participating in groups: Yes. Taking medication as prescribed: Yes. Toleration medication:  Yes. Family/Significant other contact made: Yes, individual(s) contacted:  with mother Patient understands diagnosis: No. Discussing patient identified problems/goals with staff: Yes. Medical problems stabilized or resolved: Yes. Denies suicidal/homicidal ideation: Yes. Issues/concerns per patient self-inventory: No. Other: None  New problem(s) identified: No, Describe:  none  New Short Term/Long Term Goal(s): medication stabilization, elimination of SI thoughts, development of comprehensive mental wellness plan.  Patient Goals:  Pt was asleep.  Discharge Plan or Barriers: Patient is to follow up with [facility] for therapy and psychiatry at discharge  Reason for Continuation of Hospitalization: Hallucinations Mania Medication stabilization  Estimated Length of Stay: 3-5 days  Attendees: Patient:  01/07/2021   Physician:  01/07/2021   Nursing:  01/07/2021   RN Care Manager: 01/07/2021   Social Worker: Ruthann Cancer, LCSW 01/07/2021   Recreational Therapist:  01/07/2021   Other:  01/07/2021   Other:  01/07/2021   Other: 01/07/2021       Scribe for Treatment Team: Otelia Santee, LCSW 01/07/2021 10:21 AM

## 2021-01-07 NOTE — [facility] Group Notes (Signed)
[facility] LCSW Group Therapy  01/07/2021 2:51 PM  Type of Therapy:  Movement Therapy  Participation Level:  Active  Participation Quality:  Appropriate  Affect:  Appropriate  Cognitive:  Appropriate  Modes of Intervention:  Activity  Summary of Progress/Problems: Patient attended and participate in group. Patient was appropriate when engaging with peers.   Jesse Bryan [name] 01/07/2021, 2:51 PM

## 2021-01-07 NOTE — Progress Notes (Signed)
Pt continues to be paranoid on the unit. Pt comes out his room every time another pt comes down the hall [facility]). Pt paranoid that this pt is out to get him.

## 2021-01-07 NOTE — Progress Notes (Signed)
   01/07/21 1100  Psych Admission Type (Psych Patients Only)  Admission Status Involuntary  Psychosocial Assessment  Patient Complaints Anxiety  Eye Contact Suspiciousness;Watchful  Facial Expression Anxious;Worried  Affect Anxious;Preoccupied  Pensions consultant;Tangential  Interaction Assertive;Attention-seeking;Demanding;Intrusive  Motor Activity Fidgety;Restless  Appearance/Hygiene Unremarkable  Behavior Characteristics Impulsive;Intrusive  Mood Anxious  Thought Process  Coherency Disorganized;Flight of ideas;Tangential  Content Delusions;Preoccupation;Paranoia  Delusions Grandeur;Paranoid;Religious  Perception Derealization  Hallucination None reported or observed  Judgment Poor  Confusion Mild  Danger to Self  Current suicidal ideation? Denies  Danger to Others  Danger to Others None reported or observed  Dar Note: Patient presents with anxious affect and mood.  Preoccupied with getting discharge.  Needs a little redirection on the unit for being intrusive.  Medication given as prescribed.  Routine safety checks maintained.  Patient is safe on and off the unit.

## 2021-01-07 NOTE — Progress Notes (Signed)
Pt disorganized needing redirection, but pleasant. Pt has no insight into Tx.      01/07/21 2100  Psych Admission Type (Psych Patients Only)  Admission Status Involuntary  Psychosocial Assessment  Patient Complaints Anxiety  Eye Contact Suspiciousness;Watchful  Facial Expression Anxious;Worried  Affect Anxious;Preoccupied  Pensions consultant;Tangential  Interaction Assertive;Attention-seeking;Demanding;Intrusive  Motor Activity Fidgety;Restless  Appearance/Hygiene Unremarkable  Behavior Characteristics Intrusive;Impulsive  Mood Labile;Anxious;Pleasant  Thought Process  Coherency Disorganized;Flight of ideas;Tangential  Content Delusions;Preoccupation;Paranoia  Delusions Grandeur;Paranoid;Religious  Perception Derealization  Hallucination None reported or observed  Judgment Poor  Confusion Mild  Danger to Self  Current suicidal ideation? Denies  Danger to Others  Danger to Others None reported or observed

## 2021-01-07 NOTE — Progress Notes (Signed)
Pt was involved in a verbal altercation with another pt. Pt adhered to writer redirection of stopping the verbal altercation and went to his room without any other incident. 

## 2021-01-07 NOTE — Progress Notes (Signed)
Pt stated he was having hard time sleeping. Writer explained to pt that he refused his sleep medication. Writer eventually convinced pt to take Trazodone and Vistaril PRN per MAR.

## 2021-01-07 NOTE — Progress Notes (Signed)
Adult Psychoeducational Group Note  Date:  01/07/2021 Time:  8:46 PM  Group Topic/Focus:  Wrap-Up Group:   The focus of this group is to help patients review their daily goal of treatment and discuss progress on daily workbooks.  Participation Level:  Active  Participation Quality:  Appropriate  Affect:  Appropriate  Cognitive:  Appropriate  Insight: Appropriate  Engagement in Group:  Developing/Improving  Modes of Intervention:  Discussion  Additional Comments:  Pt stated his goal for today was to focus on his treatment plan and talk with his doctor about his discharge plan.Pt stated he accomplished his goals today. Pt stated he hopes to discharge on Tuesday (01/08/2021). Pt statedhe talkedwithhis doctorandsocial worker about his care today. Pt rated his overall day a10. Pt stated his relationship with his family and support system needs to be improved. Pt stated he contacted his wifeand mothertoday which improved his day.Pt stated he was able to attend all meals. Pt stated he took all medications provided today. Pt stated he felt better about himself today. Pt stated his appetite was pretty goodtoday. Pt rated sleep last night was pretty good. Pt stated the goal for tonight was to get some rest.Pt stated he was no physical pain today. Pt deny auditory or visual hallucinations. Pt denies thoughts of harming himself or others. Pt stated he would alert staff if anything changes.  Felipa Furnace 01/07/2021, 8:46 PM

## 2021-01-07 NOTE — Progress Notes (Signed)
Recreation Therapy Notes  Date: 2.21.22 Time: 1000 Location: 500 Hall Dayroom  Group Topic: Communication  Goal Area(s) Addresses:  Patient will effectively communicate with peers in group.  Patient will verbalize benefit of healthy communication. Patient will verbalize positive effect of healthy communication on post d/c goals.  Patient will identify communication techniques that made activity effective for group.   Behavioral Response: Engaged  Intervention: Blank paper, Geometrical shape pictures, Pencils   Activity: Geometrical Drawings.  Three volunteers will come to the front of the room and describe a picture that was given to them in as much detail as possible to the rest of the group.  The remaining group members will draw the picture how it is described to them.  Patients can not ask any detailed questions of the presenter, they can only ask the presenter to repeat themselves.  When each drawing is complete the presenter will show the group what the original picture looks like so the group can see how close they were to the original.  Education: Communication, Discharge Planning  Education Outcome: Acknowledges understanding/In group clarification offered/Needs additional education.   Clinical Observations/Feedback: Pt arrived late to group session.  LRT spoke with patient to catch him up with the rest of the group.  Pt did a good job of describing his picture to the group.  Pt was detailed and made sure the group was keeping up with him.  Pt explained one way to get others to understand what you're saying is to "repeat yourself".  Pt also stated people can misunderstand what you say by your eye contact or by being loud.     Caroll Rancher, LRT/CTRS    Caroll Rancher A 01/07/2021 11:36 AM

## 2021-01-07 NOTE — Progress Notes (Signed)
[facility] MD Progress Note  01/07/2021 12:56 PM Jesse Bryan  MRN:  [id number] Subjective:  This AM patient reports that he is hoping to go soon.Patient appears to have also become fixated on discharge today, despite not being told he would be discharged today patient told his fiance this was likely. Patient was able to admit he had been doing this because he wanted to decreased the chances of his mother driving him "because she is slow."   When patient is asked why he was originally hospitalized he beings talking about spending too much time on Instagram and having people with negative energies in his life. Patient reports that he wants to go home because he is worried about his mother and fiance worrying about him. atient reports that he worries about his family. Patient talks about a patient who was discharged last week and reports that he is worried that that person knows where he lives because of what he drew. Patient reports that this person drew a home with a basketball goal in front of the house and patient does not think that this person could afford this and believes that he really drew the patient's home. Patient also reports that the tech who he continues to think as "negative energy" was playing both he and this other person trying to make them get along. Other then this patient does not endorse worrying about other things and patient reports that he is interested in starting a life with more "postive energy" and getting rid of people who he believes are "negative energy." Patient reports that the people he use to call friends that his mother will not allow in her home, will no longer be allowed into his home. Patient also reports that he is currently trying to change his image and shows that he has been trying to put twist in his hair, but so far has been unsuccessful. Patient reports that he will try another style if he continues to not be able to do it.  Patient reports that he likes his current  medication because it helps him think better and less disorganized. Patient reports that he has every intention of continuing his medication at discharge because he does not want to return to the hospital. Patient also reports that he is eating and sleeping well. Patient does not endorse SI, HI, nor AVH.  Principal Problem: Psychosis (HCC) Diagnosis: Principal Problem:   Psychosis (HCC) Active Problems:   Cannabis use with psychotic disorder (HCC)   Schizophrenia spectrum disorder with psychotic disorder type not yet determined (HCC)  Total Time spent with patient: 30 minutes  Past Psychiatric History: See H&P  Past Medical History: History reviewed. No pertinent past medical history. History reviewed. No pertinent surgical history. Family History: History reviewed. No pertinent family history. Family Psychiatric  History: See H&P Social History:  Social History   Substance and Sexual Activity  Alcohol Use Not Currently   Comment: socially     Social History   Substance and Sexual Activity  Drug Use Not Currently  . Types: Marijuana   Comment: reports no use since [facility] visit    Social History   Socioeconomic History  . Marital status: Single    Spouse name: Not on file  . Number of children: Not on file  . Years of education: Not on file  . Highest education level: Not on file  Occupational History  . Not on file  Tobacco Use  . Smoking status: Never Smoker  . Smokeless tobacco:  Never Used  Vaping Use  . Vaping Use: Former  Substance and Sexual Activity  . Alcohol use: Not Currently    Comment: socially  . Drug use: Not Currently    Types: Marijuana    Comment: reports no use since [facility] visit  . Sexual activity: Yes  Other Topics Concern  . Not on file  Social History Narrative  . Not on file   Social Determinants of Health   Financial Resource Strain: Not on file  Food Insecurity: Not on file  Transportation Needs: Not on file  Physical Activity: Not on  file  Stress: Not on file  Social Connections: Not on file   Additional Social History:                         Sleep: Fair  Appetite:  Good  Current Medications: Current Facility-Administered Medications  Medication Dose Route Frequency Provider Last Rate Last Admin  . acetaminophen (TYLENOL) tablet 650 mg  650 mg Oral Q6H PRN Antonieta Pert, MD      . alum & mag hydroxide-simeth (MAALOX/MYLANTA) 200-200-20 MG/5ML suspension 30 mL  30 mL Oral Q4H PRN Antonieta Pert, MD      . hydrOXYzine (ATARAX/VISTARIL) tablet 25 mg  25 mg Oral Q6H PRN Eliseo Gum B, MD   25 mg at 01/07/21 1136  . risperiDONE (RISPERDAL M-TABS) disintegrating tablet 2 mg  2 mg Oral Q8H PRN Antonieta Pert, MD       And  . LORazepam (ATIVAN) tablet 1 mg  1 mg Oral Q6H PRN Antonieta Pert, MD       And  . ziprasidone (GEODON) injection 20 mg  20 mg Intramuscular Q6H PRN Antonieta Pert, MD      . magnesium hydroxide (MILK OF MAGNESIA) suspension 30 mL  30 mL Oral Daily PRN Antonieta Pert, MD      . risperiDONE (RISPERDAL) tablet 4 mg  4 mg Oral BID Eliseo Gum B, MD      . traZODone (DESYREL) tablet 50 mg  50 mg Oral QHS PRN Antonieta Pert, MD   50 mg at 01/06/21 2158    Lab Results: No results found for this or any previous visit (from the past 48 hour(s)).  Blood Alcohol level:  Lab Results  Component Value Date   ETH <10 12/11/2020    Metabolic Disorder Labs: Lab Results  Component Value Date   HGBA1C 5.5 12/30/2020   MPG 111.15 12/30/2020   No results found for: PROLACTIN Lab Results  Component Value Date   CHOL 214 (H) 12/30/2020   TRIG 80 12/30/2020   HDL 47 12/30/2020   CHOLHDL 4.6 12/30/2020   VLDL 16 12/30/2020   LDLCALC 151 (H) 12/30/2020    Physical Findings: AIMS: Facial and Oral Movements Muscles of Facial Expression: None, normal Lips and Perioral Area: None, normal Jaw: None, normal Tongue: None, normal,Extremity Movements Upper (arms,  wrists, hands, fingers): None, normal Lower (legs, knees, ankles, toes): None, normal, Trunk Movements Neck, shoulders, hips: None, normal, Overall Severity Severity of abnormal movements (highest score from questions above): None, normal Incapacitation due to abnormal movements: None, normal Patient's awareness of abnormal movements (rate only patient's report): No Awareness, Dental Status Current problems with teeth and/or dentures?: No Does patient usually wear dentures?: No  CIWA:    COWS:     Musculoskeletal: Strength & Muscle Tone: within normal limits Gait & Station: normal Patient leans: N/A  Psychiatric Specialty Exam: Physical Exam HENT:     Head: Normocephalic and atraumatic.  Pulmonary:     Effort: Pulmonary effort is normal.  Neurological:     Mental Status: He is alert.     Review of Systems  Cardiovascular: Negative for chest pain.  Gastrointestinal: Negative for abdominal pain.  Neurological: Negative for headaches.    Blood pressure 126/76, pulse 91, temperature 98 F (36.7 C), temperature source Oral, resp. rate 18, height 5\' 9"  (1.753 m), weight 59 kg, SpO2 100 %.Body mass index is 19.2 kg/m.  General Appearance: Casual  Eye Contact:  Good  Speech:  Clear and Coherent and Slow  Volume:  Normal  Mood:  Euthymic  Affect:  Flat  Thought Process:  Tangential  Orientation:  Full (Time, Place, and Person)  Thought Content:  Illogical and Paranoid Ideation at moments patient appears to be logical but then he endorses a fear and his reason behind the fear is illogical.  Suicidal Thoughts:  No  Homicidal Thoughts:  No  Memory:  Recent;   Fair  Judgement:  Other:  Improving  Insight:  Shallow  Psychomotor Activity:  Decreased  Concentration:  Concentration: Fair  Recall:  NA  Fund of Knowledge:  Good  Language:  Good  Akathisia:  No  Handed:  Right  AIMS (if indicated):     Assets:  Communication Skills Desire for  Improvement Housing Intimacy Leisure Time Physical Health Resilience Social Support  ADL's:  Intact  Cognition:  WNL  Sleep:  Number of Hours: 5.25     Treatment Plan Summary: Daily contact with patient to assess and evaluate symptoms and progress in treatment Mr. Moody is a 25 yo patient who prior to 2022 had no known psychiatric hx. On exam today patient endorses more paranoia and illogical thinking than yesterday. Patient appears to be a bit preoccupied with a negative relationship with patient who was discharged in the middle of last week. Patient endorses paranoia about the safety of his mother and fiance. Patient's fixation on their safety appears abnormal and if discharged with these thoughts could unintentionally bring harm to others or his relationship with others. Patient also appeared more tangential than he has been in recent days and although he was able to talk fondly about his interest and dreams his overall thought process appeared somewhat tangential. Patient reports feeling that the current medication is helping him, but based on today's assessments will increase the dose. Patient does appear improved for admission and is getting more sleep; although patient was documented to get a few less hours than he did Saturday night. Patient is also continues to be less hyperreligious and continues to interact well while on the unit.  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 not yet determine Cannabis use disorder:  - Risperdal4mg  BIDfor psychosis Agitation Protocol: Risperdal 2mg , q 8h, Ativan 1mg  q6h - Ativan 2mg  q4hPRN for agitation  PRN -Tylenol 650mg  q6hPRN, pain -Maalox 67ml q4h, indigestion -Atarax 25mg PRNTID, anxiety -Milk of Mag 9mL,PRNconstipation -Trazodone 50mg  QHSPRN, insomnia PGY-1 , MD 01/07/2021, 12:56 PM

## 2021-01-08 DIAGNOSIS — F209 Schizophrenia, unspecified: Secondary | ICD-10-CM | POA: Diagnosis not present

## 2021-01-08 NOTE — Progress Notes (Signed)
[facility] MD Progress Note  01/08/2021 1:34 PM Jesse Bryan  MRN:  [id number] Subjective:  This AM patient reports that he is sleeping less than 7 hours again, because he thinks he needs to stay somewhat on guard for patient being around his room. Patient reports that he is not worried anymore but he reports that the one time he slept 7 hours was after a patient who he did not like left because he felt like he was able to relax. Patient appeared to get into a verbal disagreement with another patient who has consistently remarked behavior he finds odd (this patient was in fact doing things that the patient saw and was known to scare some other patients by his behavior). Patient reports that he will try to sleep more tonight. Patient reports being aware that staff are on the unit to monitor everyone and keep patient safe. Patient also reports that he is worried about his own mother because he is the youngest child. Patient reports that he thinks his mother may be drinking because she is worried about him and he also worries about how is fiance is safe without him being at home. Patient is worried that some of his friends that his mother does not like and other people with "negative energy" may go to his mother's house. Patient reports that he still eating well and that he will try to sleep better tonight. Patient reports that he thinks he will have to take his bible back from a another patient he lent it to to help him sleep better, but he will make an effort. Patient also denies SI, HI, and AVH. 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 No pertinent past medical history. History reviewed. No  pertinent surgical history. Family History: History reviewed. No pertinent family history. Family Psychiatric  History: See H&P Social History:  Social History   Substance and Sexual Activity  Alcohol Use Not Currently   Comment: socially     Social History   Substance and Sexual Activity  Drug Use Not Currently  . Types: Marijuana   Comment: reports no use since [facility] visit    Social History   Socioeconomic History  . Marital status: Single    Spouse name: Not on file  . Number of children: Not on file  . Years of education: Not on file  . Highest education level: Not on file  Occupational History  . Not on file  Tobacco Use  . Smoking status: Never Smoker  . Smokeless tobacco: Never Used  Vaping Use  . Vaping Use: Former  Substance and Sexual Activity  . Alcohol use: Not Currently    Comment: socially  . Drug use: Not Currently    Types: Marijuana    Comment: reports no use since [facility] visit  . Sexual activity: Yes  Other Topics Concern  . Not on file  Social History Narrative  . Not on file   Social Determinants of Health   Financial Resource Strain: Not on file  Food Insecurity: Not on file  Transportation Needs: Not on file  Physical Activity: Not on file  Stress: Not on file  Social Connections: Not on file   Additional Social History:  Sleep: Fair  Appetite:  Good  Current Medications: Current Facility-Administered Medications  Medication Dose Route Frequency Provider Last Rate Last Admin  . acetaminophen (TYLENOL) tablet 650 mg  650 mg Oral Q6H PRN Antonieta Pert, MD      . alum & mag hydroxide-simeth (MAALOX/MYLANTA) 200-200-20 MG/5ML suspension 30 mL  30 mL Oral Q4H PRN Antonieta Pert, MD      . hydrOXYzine (ATARAX/VISTARIL) tablet 25 mg  25 mg Oral Q6H PRN Bobbye Morton, MD   25 mg at 01/07/21 2135  . magnesium hydroxide (MILK OF MAGNESIA) suspension 30 mL  30 mL Oral Daily PRN Antonieta Pert, MD       . risperiDONE (RISPERDAL M-TABS) disintegrating tablet 2 mg  2 mg Oral Q8H PRN Antonieta Pert, MD      . risperiDONE (RISPERDAL) tablet 4 mg  4 mg Oral BID Eliseo Gum B, MD   4 mg at 01/08/21 0755  . traZODone (DESYREL) tablet 50 mg  50 mg Oral QHS PRN Antonieta Pert, MD   50 mg at 01/07/21 2135    Lab Results: No results found for this or any previous visit (from the past 48 hour(s)).  Blood Alcohol level:  Lab Results  Component Value Date   ETH <10 12/11/2020    Metabolic Disorder Labs: Lab Results  Component Value Date   HGBA1C 5.5 12/30/2020   MPG 111.15 12/30/2020   No results found for: PROLACTIN Lab Results  Component Value Date   CHOL 214 (H) 12/30/2020   TRIG 80 12/30/2020   HDL 47 12/30/2020   CHOLHDL 4.6 12/30/2020   VLDL 16 12/30/2020   LDLCALC 151 (H) 12/30/2020    Physical Findings: AIMS: Facial and Oral Movements Muscles of Facial Expression: None, normal Lips and Perioral Area: None, normal Jaw: None, normal Tongue: None, normal,Extremity Movements Upper (arms, wrists, hands, fingers): None, normal Lower (legs, knees, ankles, toes): None, normal, Trunk Movements Neck, shoulders, hips: None, normal, Overall Severity Severity of abnormal movements (highest score from questions above): None, normal Incapacitation due to abnormal movements: None, normal Patient's awareness of abnormal movements (rate only patient's report): No Awareness, Dental Status Current problems with teeth and/or dentures?: No Does patient usually wear dentures?: No  CIWA:    COWS:     Musculoskeletal: Strength & Muscle Tone: within normal limits Gait & Station: normal Patient leans: N/A  Psychiatric Specialty Exam: Physical Exam HENT:     Head: Normocephalic.  Pulmonary:     Effort: Pulmonary effort is normal.  Neurological:     Mental Status: He is alert.     Review of Systems  Cardiovascular: Negative for chest pain.  Gastrointestinal: Negative for  abdominal pain.  Neurological: Negative for headaches.    Blood pressure 107/64, pulse 91, temperature 97.8 F (36.6 C), temperature source Oral, resp. rate 18, height 5\' 9"  (1.753 m), weight 59 kg, SpO2 100 %.Body mass index is 19.2 kg/m.  General Appearance: Fairly Groomed brushed his hair today, because patient thought he needed to attend court for his IVC hearing  Eye Contact:  Good  Speech:  Clear and Coherent  Volume:  Decreased  Mood:  Euthymic  Affect:  Flat  Thought Process:  Goal Directed but tangential  Orientation:  Full (Time, Place, and Person)  Thought Content:  Illogical and Paranoid Ideation  Suicidal Thoughts:  No  Homicidal Thoughts:  No  Memory:  Recent;   Good  Judgement:  Other:  Improving  Insight:  Shallow  Psychomotor Activity:  Decreased  Concentration:  Concentration: Fair  Recall:  NA  Fund of Knowledge:  Good  Language:  Good  Akathisia:  No    AIMS (if indicated):     Assets:  Communication Skills Desire for Improvement Housing Intimacy Leisure Time Physical Health Resilience Social Support  ADL's:  Intact  Cognition:  WNL  Sleep:  Number of Hours: 5.5     Treatment Plan Summary: Daily contact with patient to assess and evaluate symptoms and progress in treatment Mr. Radu is a 25 yo patient who prior to 2022 had no known psychiatric hx. Patient continues to endorse some paranoia that is illogical. When asking patient about why he worries about certain things at times patient appears unable to answer or provide reasoning so he will change subjects. Patient sleep has remained the same and patient was able to provide some insight onto why his sleep has started regressing. Patient endorsed that there was something he could do to make him feel more comfortable so that he could sleep better. Patient also appears to continue to feel that the Risperdal helps him think clearer. Will watch patient and reassess in the AM as this is patient's first day  on 8 mg total. Patient appears to get along with some patient's better than others and appears to split patients and does better with patient's that he feels behave more "positively" or normal.   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 not yet determine Cannabis use disorder:  - Risperdal4mg BIDfor psychosis Agitation Protocol: Risperdal 2mg , q 8h, Ativan 1mg  q6h - Ativan 2mg  q4hPRN for agitation  PRN -Tylenol 650mg  q6hPRN, pain -Maalox 41ml q4h, indigestion -Atarax 25mg PRNTID, anxiety -Milk of Mag 66mL,PRNconstipation -Trazodone 50mg  QHSPRN, insomnia  PGY-1 , MD 01/08/2021, 1:34 PM

## 2021-01-08 NOTE — Progress Notes (Signed)
   01/08/21 0500  Sleep  Number of Hours 5.5   

## 2021-01-08 NOTE — Progress Notes (Signed)
Adult Psychoeducational Group Note  Date:  01/08/2021 Time:  11:39 PM  Group Topic/Focus:  Wrap-Up Group:   The focus of this group is to help patients review their daily goal of treatment and discuss progress on daily workbooks.  Participation Level:  Active  Participation Quality:  Appropriate  Affect:  Anxious  Cognitive:  Disorganized and Confused  Insight: Limited  Engagement in Group:  Limited  Modes of Intervention:  Discussion  Additional Comments:  Pt stated his goal for today was to focus on his treatment plan. Pt stated he accomplished his goaltoday. Pt statedhe did not  talkedwithhis doctorandsocial worker about his care today. Pt later stated that he think he did but not sure if he talk with his doctor or social worker today. Pt rated his overall day a10. Pt stated his relationship with his family and support system needs to be improved. Pt stated he contacted his mother, girlfriend, and brothertoday which improved his day.Pt stated he was able to attend all meals. Pt stated he took all medications provided today. Pt stated he felt better about himself today. Pt stated his appetite was pretty goodtoday. Pt rated sleep last night as fair. Pt stated the goal for tonight was to get some rest.Pt stated he was no physical pain today. Pt deny auditory or visual hallucinations. Pt denies thoughts of harming himself or others. Pt stated he would alert staff if anything changes.  Felipa Furnace 01/08/2021, 11:39 PM

## 2021-01-08 NOTE — Progress Notes (Addendum)
   01/08/21 2100  Psych Admission Type (Psych Patients Only)  Admission Status Involuntary  Psychosocial Assessment  Patient Complaints Suspiciousness  Eye Contact Suspiciousness;Watchful  Facial Expression Anxious;Worried  Affect Anxious;Preoccupied  Pensions consultant;Tangential  Interaction Assertive;Intrusive  Motor Activity Fidgety;Restless  Appearance/Hygiene Unremarkable  Behavior Characteristics Cooperative;Impulsive;Intrusive  Mood Suspicious;Preoccupied  Thought Process  Coherency Disorganized;Flight of ideas;Tangential  Content Delusions;Preoccupation;Paranoia  Delusions Paranoid;Persecutory  Perception Derealization  Hallucination None reported or observed  Judgment Poor  Confusion UTA  Danger to Self  Current suicidal ideation? Denies  Danger to Others  Danger to Others None reported or observed   Pt denies SI, HI, AVH and pain. Pt believes that his friends have "set him up on social media."  "They have me this hoodie and I posted it on social media and then I ended up here." Pt believes that he is not safe on the 500 hall and wants to be on another hall. Pt refused Trazodone and Vistaril because it makes him too sleepy and he wants to be on his guard for the pts he sees as a threat and will try to hurt him. Pt explained the reasons for pt placement on different halls. Pt wants to be discharged. Pt advised to speak with provider in the morning. Pt takes prescribed meds without incident.

## 2021-01-08 NOTE — Progress Notes (Signed)
   01/08/21 1000  Psych Admission Type (Psych Patients Only)  Admission Status Involuntary  Psychosocial Assessment  Patient Complaints Worrying  Eye Contact Suspiciousness;Watchful  Facial Expression Anxious;Worried  Affect Anxious;Preoccupied  Pensions consultant;Tangential  Interaction Assertive;Attention-seeking;Demanding;Intrusive  Motor Activity Fidgety;Restless  Appearance/Hygiene Unremarkable  Behavior Characteristics Impulsive;Intrusive  Mood Preoccupied  Thought Process  Coherency Disorganized;Flight of ideas;Tangential  Content Delusions;Preoccupation;Paranoia  Delusions Grandeur;Paranoid;Religious  Perception Derealization  Hallucination None reported or observed  Judgment Poor  Confusion Mild  Danger to Self  Current suicidal ideation? Denies  Danger to Others  Danger to Others None reported or observed

## 2021-01-08 NOTE — Progress Notes (Signed)
Recreation Therapy Notes  Date: 2.22.22 Time: 1000 Location: 500 Hall Dayroom  Group Topic: Coping Skills  Goal Area(s) Addresses:  Patient will identify positive coping skills. Patient will identify benefit of using coping skills post d/c.  Intervention: Worksheet, magazines, glue sticks, music  Activity: Pharmacologist.  Patients were given a worksheet broken down into diversions, social, cognitive, tension releasers and physical.  Patients were to look through the magazines and identify at least two coping skills for each category.  Patients would then glue those coping skills under the corresponding category.  Education: Pharmacologist, Building control surveyor.   Education Outcome: Acknowledges understanding/In group clarification offered/Needs additional education.   Clinical Observations/Feedback: Pt did not attend group session.    Caroll Rancher, LRT/CTRS         Caroll Rancher A 01/08/2021 12:06 PM

## 2021-01-08 NOTE — [facility] Counselor (Signed)
CSW met with this patient per pt's request. Pt stated that he was making plans to marry his girlfriend when he gets out of the hospital and stated that he and his girlfriend connect on a spiritual level which allows him to know how she is doing at all times. Patient stated he only wanted this CSW to take him home and did not trust any other staff to take him home. CSW explained to patient about transportation services used in which pt requested a picture of the driver before he would accept the ride and stated he did not trust everyone to follow him home.     Darletta Moll MSW, LCSW Clincal Social Worker  [facility]

## 2021-01-09 DIAGNOSIS — F12959 Cannabis use, unspecified with psychotic disorder, unspecified: Secondary | ICD-10-CM

## 2021-01-09 MED ORDER — OLANZAPINE 5 MG PO TBDP
5.0000 mg | ORAL_TABLET | Freq: Every day | ORAL | Status: DC
  Administered 2021-01-09: 5 mg via ORAL
  Filled 2021-01-09: qty 1

## 2021-01-09 MED ORDER — OLANZAPINE 5 MG PO TBDP
5.0000 mg | ORAL_TABLET | Freq: Every day | ORAL | Status: DC
  Administered 2021-01-10: 5 mg via ORAL
  Filled 2021-01-09 (×2): qty 1

## 2021-01-09 MED ORDER — OLANZAPINE 10 MG PO TBDP
10.0000 mg | ORAL_TABLET | Freq: Three times a day (TID) | ORAL | Status: DC | PRN
  Administered 2021-01-10: 10 mg via ORAL
  Filled 2021-01-09: qty 1

## 2021-01-09 MED ORDER — OLANZAPINE 5 MG PO TBDP
15.0000 mg | ORAL_TABLET | Freq: Every day | ORAL | Status: DC
Start: 2021-01-09 — End: 2021-01-13
  Administered 2021-01-09 – 2021-01-12 (×4): 15 mg via ORAL
  Filled 2021-01-09 (×4): qty 3
  Filled 2021-01-09: qty 1
  Filled 2021-01-09: qty 3

## 2021-01-09 MED ORDER — ZIPRASIDONE MESYLATE 20 MG IM SOLR: 20.0000 mg | INTRAMUSCULAR | Status: DC | PRN

## 2021-01-09 MED ORDER — LORAZEPAM 1 MG PO TABS: 1.0000 mg | ORAL_TABLET | ORAL | Status: DC | PRN

## 2021-01-09 NOTE — [facility] Group Notes (Signed)
[facility] LCSW Group Therapy  01/09/2021 1:36 PM  Type of Therapy and Topic: Group Therapy: Anger Management   Description of Group: In this group, patients will learn helpful strategies and techniques to manage anger, express anger in alternative ways, change hostile attitudes, and prevent aggressive acts, such as verbal abuse and violence.This group will be process-oriented and eductional, with patients participating in exploration of their own experiences as well as giving and receiving support and challenge from other group members.  Therapeutic Goals: 1. Patient will learn to manage anger. 2. Patient will learn to stop violence or the threat of violence. 3. Patient will learn to develop self control over thoughts and actions. 4. Patient will receive support and feedback from others  Therapeutic Modalities: Cognitive Behavioral Therapy Solution Focused Therapy Motivational Interviewing  Due to scheduling conflicts, this group has been supplemented with worksheets.  Juliannah Ohmann A Audrick Lamoureaux 01/09/2021, 1:36 PM   

## 2021-01-09 NOTE — Progress Notes (Signed)
[facility] MD Progress Note  01/09/2021 12:01 PM Jesse Bryan  MRN:  [id number] Subjective:  This AM patient reports that he he is feeling better than he did yesterday afternoon. Patient admits that he decompensated a bit after the STAR was called on another patient and patient was found in the hall while the patient was screaming during the STAR. After the STAR patient was noted to be quite anxious and rubbing a cross into his forehead and did not want his family to bring him new clothes. This AM patient reports that he did stop rubbing the cross into his forehead and he did allow his family to bring him clothes and he is very happy to be wearing different, clean clothes. Patient reports that he is eating well and he slept 7h last night and thinks he will try to take a nap this AM. Patient denies SI, HI, and AVH.  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 No pertinent past medical history. History reviewed. No pertinent surgical history. Family History: History reviewed. No pertinent family history. Family Psychiatric  History: See H&P Social History:  Social History   Substance and Sexual Activity  Alcohol Use Not Currently   Comment: socially     Social History   Substance and Sexual Activity  Drug Use Not Currently  . Types: Marijuana   Comment: reports no use since [facility] visit    Social History   Socioeconomic History  . Marital status: Single    Spouse name: Not on file  . Number of children: Not on file  . Years of education: Not on file  . Highest education level: Not on file  Occupational History  . Not on file  Tobacco Use  . Smoking status: Never Smoker  .  Smokeless tobacco: Never Used  Vaping Use  . Vaping Use: Former  Substance and Sexual Activity  . Alcohol use: Not Currently    Comment: socially  . Drug use: Not Currently    Types: Marijuana    Comment: reports no use since [facility] visit  . Sexual activity: Yes  Other Topics Concern  . Not on file  Social History Narrative  . Not on file   Social Determinants of Health   Financial Resource Strain: Not on file  Food Insecurity: Not on file  Transportation Needs: Not on file  Physical Activity: Not on file  Stress: Not on file  Social Connections: Not on file   Additional Social History:                         Sleep: Improving  Appetite:  Good  Current Medications: Current Facility-Administered Medications  Medication Dose Route Frequency Provider Last Rate Last Admin  . acetaminophen (TYLENOL) tablet 650 mg  650 mg Oral Q6H PRN Antonieta Pert, MD      . alum & mag hydroxide-simeth (MAALOX/MYLANTA) 200-200-20 MG/5ML suspension 30 mL  30 mL Oral Q4H PRN Antonieta Pert, MD      . hydrOXYzine (ATARAX/VISTARIL) tablet 25 mg  25 mg Oral Q6H PRN Bobbye Morton, MD   25 mg at 01/09/21 0741  . magnesium hydroxide (MILK OF MAGNESIA) suspension 30 mL  30  mL Oral Daily PRN Antonieta Pert, MD      . OLANZapine zydis [facility]) disintegrating tablet 15 mg  15 mg Oral QHS Antonieta Pert, MD      . OLANZapine zydis [facility] disintegrating tablet 5 mg  5 mg Oral QHS Antonieta Pert, MD   5 mg at 01/09/21 1138  . risperiDONE (RISPERDAL M-TABS) disintegrating tablet 2 mg  2 mg Oral Q8H PRN Antonieta Pert, MD      . traZODone (DESYREL) tablet 50 mg  50 mg Oral QHS PRN Antonieta Pert, MD   50 mg at 01/07/21 2135    Lab Results: No results found for this or any previous visit (from the past 48 hour(s)).  Blood Alcohol level:  Lab Results  Component Value Date   ETH <10 12/11/2020    Metabolic Disorder Labs: Lab Results  Component Value Date    HGBA1C 5.5 12/30/2020   MPG 111.15 12/30/2020   No results found for: PROLACTIN Lab Results  Component Value Date   CHOL 214 (H) 12/30/2020   TRIG 80 12/30/2020   HDL 47 12/30/2020   CHOLHDL 4.6 12/30/2020   VLDL 16 12/30/2020   LDLCALC 151 (H) 12/30/2020    Physical Findings: AIMS: Facial and Oral Movements Muscles of Facial Expression: None, normal Lips and Perioral Area: None, normal Jaw: None, normal Tongue: None, normal,Extremity Movements Upper (arms, wrists, hands, fingers): None, normal Lower (legs, knees, ankles, toes): None, normal, Trunk Movements Neck, shoulders, hips: None, normal, Overall Severity Severity of abnormal movements (highest score from questions above): None, normal Incapacitation due to abnormal movements: None, normal Patient's awareness of abnormal movements (rate only patient's report): No Awareness, Dental Status Current problems with teeth and/or dentures?: No Does patient usually wear dentures?: No  CIWA:    COWS:     Musculoskeletal: Strength & Muscle Tone: within normal limits Gait & Station: normal Patient leans: N/A  Psychiatric Specialty Exam: Physical Exam HENT:     Head: Normocephalic and atraumatic.  Pulmonary:     Effort: Pulmonary effort is normal.  Neurological:     Mental Status: He is alert.     Review of Systems  Cardiovascular: Negative for chest pain.  Gastrointestinal: Negative for abdominal pain.  Neurological: Negative for headaches.    Blood pressure 111/61, pulse 79, temperature 97.7 F (36.5 C), temperature source Oral, resp. rate 18, height 5\' 9"  (1.753 m), weight 59 kg, SpO2 100 %.Body mass index is 19.2 kg/m.  General Appearance: Fairly Groomed  Eye Contact:  Good  Speech:  Slow  Volume:  Normal  Mood:  Euthymic  Affect:  Flat  Thought Process:  Goal Directed less tangential this AM  Orientation:  NA  Thought Content:  Logical and Paranoid Ideation towards some staff, but actively trying to not  allow things to bother him as much  Suicidal Thoughts:  No  Homicidal Thoughts:  No  Memory:  Recent;   Fair  Judgement:  Other:  Improving  Insight:  improving  Psychomotor Activity:  Decreased  Concentration:  Concentration: Fair  Recall:  NA  Fund of Knowledge:  Fair  Language:  Fair  Akathisia:  No  Handed:  Right  AIMS (if indicated):     Assets:  Communication Skills Desire for Improvement Housing Intimacy Leisure Time Physical Health Resilience Social Support  ADL's:  Intact  Cognition:  WNL  Sleep:  Number of Hours: 7     Treatment Plan Summary: Daily contact with patient to  assess and evaluate symptoms and progress in treatment Jesse Bryan is a 25 yo patient who prior to 2022 had no known psychiatric hx. Patient continues to have some paranoia. Discussed with patient that we will need to transition to another antipsychotic as patient paranoia continues to be an issue, despite improvements in patient's thought process and decrease in his hyperreligiousity. With the change in antipsychotics patient should be less likely to decompensate in the future; although patient does appear to have been able to practice some self-soothing techniques and did continue to take his scheduled medications after the episode yesterday afternoon.   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 not yet determine Cannabis use disorder:  - Discontinue Risperdal4mg BIDfor psychosis - Start Zyprexa 5mg  daily and 15mg  QHS Discontinue Agitation Protocol: Risperdal 2mg , q 8h, Ativan 1mg  q6h Agitation Protocol: 10mg  q8h, Ativan 1mg , Geodon 20mg  IM - Ativan 2mg  q4hPRN for agitation  PRN -Tylenol 650mg  q6hPRN, pain -Maalox 44ml q4h, indigestion -Atarax 25mg PRNTID, anxiety -Milk of Mag 13mL,PRNconstipation -Trazodone 50mg  QHSPRN, insomnia  PGY-1 , MD 01/09/2021, 12:01 PM

## 2021-01-09 NOTE — Progress Notes (Signed)
Progress note    01/09/21 0740  Psych Admission Type (Psych Patients Only)  Admission Status Involuntary  Psychosocial Assessment  Patient Complaints Anxiety  Eye Contact Suspiciousness;Watchful  Facial Expression Animated;Anxious;Pensive  Affect Anxious;Preoccupied  Speech Pressured;Tangential  Interaction Assertive;Intrusive;Needy  Motor Activity Fidgety;Pacing;Restless  Appearance/Hygiene Unremarkable  Behavior Characteristics Cooperative;Appropriate to situation;Anxious  Mood Anxious;Suspicious;Preoccupied;Pleasant  Thought Chartered certified accountant of ideas;Tangential  Content Delusions;Preoccupation;Paranoia  Delusions Grandeur;Paranoid  Perception Derealization  Hallucination None reported or observed  Judgment Poor  Confusion None  Danger to Self  Current suicidal ideation? Denies  Danger to Others  Danger to Others None reported or observed

## 2021-01-09 NOTE — Progress Notes (Signed)
Pt continues to be anxious and paranoid on the unit, but pt needs less redirection. Pt thoughts appear a little more organized. Pt educated on the Zyprexa.      01/09/21 2100  Psych Admission Type (Psych Patients Only)  Admission Status Involuntary  Psychosocial Assessment  Patient Complaints Anxiety  Eye Contact Suspiciousness;Watchful  Facial Expression Animated;Anxious;Pensive  Affect Anxious;Preoccupied  Speech Pressured;Tangential  Interaction Assertive;Intrusive;Needy  Motor Activity Fidgety;Pacing;Restless  Appearance/Hygiene Unremarkable  Behavior Characteristics Anxious;Restless  Mood Anxious;Preoccupied;Suspicious  Thought Chartered certified accountant of ideas;Tangential  Content Delusions;Preoccupation;Paranoia  Delusions Grandeur;Paranoid  Perception Derealization  Hallucination None reported or observed  Judgment Poor  Confusion None  Danger to Self  Current suicidal ideation? Denies  Danger to Others  Danger to Others None reported or observed

## 2021-01-09 NOTE — Plan of Care (Signed)
  Problem: Safety: Goal: Ability to redirect hostility and anger into socially appropriate behaviors will improve Outcome: Progressing   Problem: Self-Care: Goal: Ability to participate in self-care as condition permits will improve Outcome: Progressing   Problem: Self-Concept: Goal: Will verbalize positive feelings about self Outcome: Progressing

## 2021-01-09 NOTE — Progress Notes (Signed)
Recreation Therapy Notes  Date: 2.23.22 Time: 1000 Location: 500 Hall Dayroom  Group Topic: Wellness  Goal Area(s) Addresses:  Patient will define components of whole wellness. Patient will verbalize benefit of whole wellness.  Intervention: Music, Chairs  Activity: Exercise.  LRT and patients did a series of stretches to loosen the body to prevent any injuries during the course of the exercises.  After stretching, each patient got the chance to lead the group in an exercise of their choice.  LRT and patients were to complete at least 30 minutes of exercise.  Patients were encouraged to take breaks and get water if needed.  Education: Wellness, Discharge Planning.   Education Outcome: Acknowledges education/In group clarification offered/Needs additional education.   Clinical Observations/Feedback: Pt did not attend group session.    Chyna Kneece, LRT/CTRS         Phil Corti A 01/09/2021 11:49 AM 

## 2021-01-10 MED ORDER — OLANZAPINE 10 MG PO TBDP
10.0000 mg | ORAL_TABLET | Freq: Every day | ORAL | Status: DC
  Administered 2021-01-11 – 2021-01-13 (×3): 10 mg via ORAL
  Filled 2021-01-10 (×4): qty 1

## 2021-01-10 MED ORDER — OLANZAPINE 2.5 MG PO TABS: 2.5000 mg | ORAL_TABLET | ORAL | Status: DC

## 2021-01-10 NOTE — [facility] Group Notes (Signed)
Occupational Therapy Group Note Date: 01/10/2021 Group Topic/Focus: Self-Care  Group Description: Group encouraged increased participation and engagement through discussion focused on Self-Care. Group members were encouraged to complete a self-care pie chart diagram that identified specific categories within self-care that need improvement, including physical, emotional/psychological, social, spiritual, and professional. Discussion focused on identifying which areas need the most work/improvement and brainstormed strategies and tips to improve self-care.  Participation Level: Patient did not attend OT group session despite personal invitation.    Plan: Continue to engage patient in OT groups 2 - 3x/week.  01/10/2021  Tarsha Blando, MOT, OTR/L 

## 2021-01-10 NOTE — Progress Notes (Deleted)
   01/10/21 0500  Sleep  Number of Hours 5.5

## 2021-01-10 NOTE — Progress Notes (Signed)
   01/10/21 0500  Sleep  Number of Hours 6.25

## 2021-01-10 NOTE — Progress Notes (Signed)
   01/10/21 1400  Psych Admission Type (Psych Patients Only)  Admission Status Involuntary  Psychosocial Assessment  Patient Complaints Irritability  Eye Contact Suspiciousness;Watchful  Facial Expression Animated;Anxious;Pensive  Affect Anxious;Preoccupied  Speech Pressured;Tangential  Interaction Assertive;Intrusive;Needy  Motor Activity Fidgety;Pacing;Restless  Appearance/Hygiene Unremarkable  Behavior Characteristics Cooperative  Mood Labile;Preoccupied  Thought Chartered certified accountant of ideas;Tangential  Content Delusions;Preoccupation;Paranoia  Delusions Grandeur;Paranoid  Perception Derealization  Hallucination None reported or observed  Judgment Poor  Confusion None  Danger to Self  Current suicidal ideation? Denies  Danger to Others  Danger to Others None reported or observed

## 2021-01-10 NOTE — Progress Notes (Addendum)
[facility] MD Progress Note  01/10/2021 12:38 PM Jesse Bryan  MRN:  [id number] Subjective:  This AM patient reports that his mind continues to feel clearer and he does appreciate the medication change as he believes that is also helping him sleep better. Patient reports that his appetite continues to be good and his mood remains positive. Patient reports that he does feel that he may need to stay a few more days, and notes that he is less anxious feeling about the safety of his family but is still worried that someone will find out where he lives. Patient denies SI, HI, and AVH. Principal Problem: Psychosis ([place]) Diagnosis: Principal Problem:   Psychosis ([place]) Active Problems:   Cannabis use with psychotic disorder ([place])   Schizophrenia spectrum disorder with psychotic disorder type not yet determined ([place])  Total Time spent with patient: 15 minutes  Past Psychiatric History: See H&P  Past Medical History: History reviewed. No pertinent past medical history. History reviewed. No pertinent surgical history. Family History: History reviewed. No pertinent family history. Family Psychiatric  History: See H&P Social History:  Social History   Substance and Sexual Activity  Alcohol Use Not Currently   Comment: socially     Social History   Substance and Sexual Activity  Drug Use Not Currently  . Types: Marijuana   Comment: reports no use since [facility] visit    Social History   Socioeconomic History  . Marital status: Single    Spouse name: Not on file  . Number of children: Not on file  . Years of education: Not on file  . Highest education level: Not on file  Occupational History  . Not on file  Tobacco Use  . Smoking status: Never Smoker  . Smokeless tobacco: Never Used  Vaping Use  . Vaping Use: Former  Substance and Sexual Activity  . Alcohol use: Not Currently    Comment: socially  . Drug use: Not Currently    Types: Marijuana    Comment: reports no use since [facility] visit   . Sexual activity: Yes  Other Topics Concern  . Not on file  Social History Narrative  . Not on file   Social Determinants of Health   Financial Resource Strain: Not on file  Food Insecurity: Not on file  Transportation Needs: Not on file  Physical Activity: Not on file  Stress: Not on file  Social Connections: Not on file   Additional Social History:                         Sleep: Improving  Appetite:  Good  Current Medications: Current Facility-Administered Medications  Medication Dose Route Frequency Provider Last Rate Last Admin  . acetaminophen (TYLENOL) tablet 650 mg  650 mg Oral Q6H PRN Sharma Covert, MD      . alum & mag hydroxide-simeth (MAALOX/MYLANTA) 200-200-20 MG/5ML suspension 30 mL  30 mL Oral Q4H PRN Sharma Covert, MD      . hydrOXYzine (ATARAX/VISTARIL) tablet 25 mg  25 mg Oral Q6H PRN Freida Busman, MD   25 mg at 01/09/21 0741  . OLANZapine zydis (ZYPREXA) disintegrating tablet 10 mg  10 mg Oral Q8H PRN Damita Dunnings B, MD   10 mg at 01/10/21 1012   And  . LORazepam (ATIVAN) tablet 1 mg  1 mg Oral PRN Damita Dunnings B, MD       And  . ziprasidone (GEODON) injection 20 mg  20  mg Intramuscular PRN Damita Dunnings B, MD      . magnesium hydroxide (MILK OF MAGNESIA) suspension 30 mL  30 mL Oral Daily PRN Sharma Covert, MD      . Derrill Memo ON 01/11/2021] OLANZapine zydis (ZYPREXA) disintegrating tablet 10 mg  10 mg Oral Daily Damita Dunnings B, MD      . OLANZapine zydis (ZYPREXA) disintegrating tablet 15 mg  15 mg Oral QHS Sharma Covert, MD   15 mg at 01/09/21 2040  . traZODone (DESYREL) tablet 50 mg  50 mg Oral QHS PRN Sharma Covert, MD   50 mg at 01/07/21 2135    Lab Results: No results found for this or any previous visit (from the past 6 hour(s)).  Blood Alcohol level:  Lab Results  Component Value Date   ETH <10 76/81/1572    Metabolic Disorder Labs: Lab Results  Component Value Date   HGBA1C 5.5 12/30/2020   MPG  111.15 12/30/2020   No results found for: PROLACTIN Lab Results  Component Value Date   CHOL 214 (H) 12/30/2020   TRIG 80 12/30/2020   HDL 47 12/30/2020   CHOLHDL 4.6 12/30/2020   VLDL 16 12/30/2020   LDLCALC 151 (H) 12/30/2020    Physical Findings: AIMS: Facial and Oral Movements Muscles of Facial Expression: None, normal Lips and Perioral Area: None, normal Jaw: None, normal Tongue: None, normal,Extremity Movements Upper (arms, wrists, hands, fingers): None, normal Lower (legs, knees, ankles, toes): None, normal, Trunk Movements Neck, shoulders, hips: None, normal, Overall Severity Severity of abnormal movements (highest score from questions above): None, normal Incapacitation due to abnormal movements: None, normal Patient's awareness of abnormal movements (rate only patient's report): No Awareness, Dental Status Current problems with teeth and/or dentures?: No Does patient usually wear dentures?: No  CIWA:    COWS:     Musculoskeletal: Strength & Muscle Tone: within normal limits Gait & Station: normal Patient leans: N/A  Psychiatric Specialty Exam: Physical Exam Constitutional:      Appearance: Normal appearance.  HENT:     Head: Normocephalic.  Pulmonary:     Effort: Pulmonary effort is normal.  Neurological:     Mental Status: He is alert.     Review of Systems  Cardiovascular: Negative for chest pain.  Gastrointestinal: Negative for abdominal pain.  Neurological: Negative for headaches.    Blood pressure (!) 110/58, pulse 68, temperature (!) 97.4 F (36.3 C), temperature source Oral, resp. rate 18, height _0  (1.753 m), weight 59 kg, SpO2 100 %.Body mass index is 19.2 kg/m.  General Appearance: Casual appears less drowsy  Eye Contact:  Good  Speech:  Clear and Coherent  Volume:  Normal  Mood:  Euthymic  Affect:  Congruent  Thought Process:  Goal Directed much less tangential  Orientation:  Full (Time, Place, and Person)  Thought Content:   Logical and Paranoid Ideation but less paranoid than yesterday  Suicidal Thoughts:  No  Homicidal Thoughts:  No  Memory:  Recent;   Good  Judgement:  Impaired  Insight:  Improving  Psychomotor Activity:  Normal  Concentration:  Concentration: Fair  Recall:  NA  Fund of Knowledge:  Good  Language:  Good  Akathisia:  No  Handed:  Right  AIMS (if indicated):     Assets:  Communication Skills Desire for Improvement Housing Intimacy Leisure Time Physical Health Resilience Social Support  ADL's:  Intact  Cognition:  WNL  Sleep:  Number of Hours: 6.25  Treatment Plan Summary: Daily contact with patient to assess and evaluate symptoms and progress in treatment Mr. Steffensen is a 25 yo patient who prior to 2022 had no known psychiatric hx. This AM after interview patient was noted to get agitated with staff and required an additional 58m of Zyprexa. Patient was seen later to be doing well and attending group. Patient mood remains unstable and he continues to have some paranoia, but patient sleep is improving and patient is not overly sedated during the day. Will adjust Zyprexa.  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 not yet determine Cannabis use disorder:  - Start Zyprexa 12mdaily and 1523mHS Agitation Protocol: Zyprexa 54m56mh, Ativan 1mg,9modon 20mg 39m Ativan 2mg q459mN for agitation  PRN -Tylenol 650mg q636m, pain -Maalox 30ml q4h74mdigestion -Atarax 25mgPRNTI64mnxiety -Milk of Mag 30mL,PRNco54mpation -Trazodone 50mg QHSPRN42msomnia  PGY-1 Jai B McQuilFreida Busman22, 12:38 PM   Atestment: Case discussed and plan agreed upon as outlined above.  I met with the patient independently today as well.  He does appear to be tolerating and improving slightly with the change in his antipsychotic medications.  He did have an episode after both the house officer and I had met with the patient.  He became agitated over what appears to  be irritability over a cup of ice.  He received Zyprexa 10 mg p.o. x1.  He does not appear to be oversedated by that, and I agree with increased dosage of the Zyprexa.

## 2021-01-10 NOTE — Progress Notes (Signed)
Pt continues to be anxious on the unit, but needing less redirection.      01/10/21 2200  Psych Admission Type (Psych Patients Only)  Admission Status Involuntary  Psychosocial Assessment  Patient Complaints Anxiety  Eye Contact Suspiciousness;Watchful  Facial Expression Animated;Anxious;Pensive  Affect Anxious;Preoccupied  Speech Pressured;Tangential  Interaction Assertive;Intrusive;Needy  Motor Activity Fidgety;Pacing;Restless  Appearance/Hygiene Unremarkable  Behavior Characteristics Cooperative  Mood Labile;Preoccupied  Thought Chartered certified accountant of ideas;Tangential  Content Delusions;Preoccupation;Paranoia  Delusions Grandeur;Paranoid  Perception Derealization  Hallucination None reported or observed  Judgment Poor  Confusion None  Danger to Self  Current suicidal ideation? Denies  Danger to Others  Danger to Others None reported or observed

## 2021-01-10 NOTE — Progress Notes (Signed)
Recreation Therapy Notes  Date: 2.24.22 Time: 1000 Location: 500 Hall Dayroom  Group Topic: Triggers  Goal Area(s) Addresses:  Patient will identify their biggest triggers. Patient will identify how to deal with triggers head on. Patient will identify how to avoid triggers.  Behavioral Response: Minimal  Intervention: Worksheet, Pencils, Ice Breaker   Activity: Triggers.  Patients were to identify triggers, how to avoid them and how to deal with them head on.  Before dealing with triggers, patients completed an ice breaker to loosen up the group and get them engaged.  Education: Triggers, Discharge Planning  Education Outcome: Acknowledges understanding/In group clarification offered/Needs additional education.   Clinical Observations/Feedback: Pt was engaged at first with the ice breaker and was working well with peers.  Pt left early because he couldn't use the phone during group session.  Pt came back asking when will the group be finished, not wanting to participate in the rest of group and being somewhat disruptive.  LRT told pt he was free to leave group and that he couldn't be a distraction to everyone else.  Pt was a little resistant because he felt he didn't do anything wrong but left and did not return.     Caroll Rancher, LRT/CTRS    Caroll Rancher A 01/10/2021 10:36 AM

## 2021-01-11 NOTE — Progress Notes (Signed)
Adult Psychoeducational Group Note  Date:  01/11/2021 Time:  11:49 PM  Group Topic/Focus:  Wrap-Up Group:   The focus of this group is to help patients review their daily goal of treatment and discuss progress on daily workbooks.  Participation Level:  Active  Participation Quality:  Appropriate  Affect:  Appropriate  Cognitive:  Appropriate  Insight: Appropriate  Engagement in Group:  Developing/Improving  Modes of Intervention:  Discussion  Additional Comments: Pt stated his goal for today was to focus on his treatment plan. Pt stated he accomplished his goaltoday. Pt statedhe talkedwithhis doctorandsocial worker about his care today. Pt rated his overall day a10. Pt stated his relationship with his family and support system needs to be improved. Pt stated he contacted his mother, girlfriend, and brothertoday which improved his day.Pt stated he was able to attend all meals. Pt stated he took all medications provided today. Pt stated he felt better about himself today. Pt stated his appetite was pretty goodtoday. Pt rated sleep last night as good. Pt stated the goal for tonight was to get some rest.Pt stated he was no physical pain today. Pt deny auditory or visual hallucinations. Pt denies thoughts of harming himself or others. Pt stated he would alert staff if anything changes.     Felipa Furnace 01/11/2021, 11:49 PM

## 2021-01-11 NOTE — Progress Notes (Signed)
Recreation Therapy Notes  Date: 2.25.22 Time: 1000 Location: 500 Hall Dayroom  Group Topic: Communication, Team Building, Problem Solving   Goal Area(s) Addresses:  Patient will effectively work with peer towards shared goal.  Patient will identify skill used to make activity successful.  Patient will identify how skills used during activity can be used to reach post d/c goals.   Intervention: Cup [employer] bands with attached strings enough for each group member, 10 or more cups  Activity: Patient(s) were given a set of solo cups, a rubber band, and some tied strings. The objective is to build a pyramid with the cups by only using the rubber band and string to move the cups. After the activity the patient(s) are LRT debriefed and discussed what strategies worked, what didn't, and what lessons they can take from the activity and use in life post discharge.   Education Areas: Social Skills, Support System, Discharge Planning   Education Outcome: Acknowledges education  Clinical Observations/Feedback: Pt did not attend group session.    Caroll Rancher, LRT/CTRS         Caroll Rancher A 01/11/2021 11:58 AM

## 2021-01-11 NOTE — Tx Team (Signed)
Interdisciplinary Treatment and Diagnostic Plan Update  01/11/2021 Time of Session: 9:00am Jesse Bryan MRN: [id number]  Principal Diagnosis: Psychosis [facility])  Secondary Diagnoses: Principal Problem:   Psychosis (HCC) Active Problems:   Cannabis use with psychotic disorder (HCC)   Schizophrenia spectrum disorder with psychotic disorder type not yet determined (HCC)   Current Medications:  Current Facility-Administered Medications  Medication Dose Route Frequency Provider Last Rate Last Admin  . acetaminophen (TYLENOL) tablet 650 mg  650 mg Oral Q6H PRN Antonieta Pert, MD      . alum & mag hydroxide-simeth (MAALOX/MYLANTA) 200-200-20 MG/5ML suspension 30 mL  30 mL Oral Q4H PRN Antonieta Pert, MD      . hydrOXYzine (ATARAX/VISTARIL) tablet 25 mg  25 mg Oral Q6H PRN Bobbye Morton, MD   25 mg at 01/09/21 0741  . OLANZapine zydis (ZYPREXA) disintegrating tablet 10 mg  10 mg Oral Q8H PRN Eliseo Gum B, MD   10 mg at 01/10/21 1012   And  . LORazepam (ATIVAN) tablet 1 mg  1 mg Oral PRN Eliseo Gum B, MD       And  . ziprasidone (GEODON) injection 20 mg  20 mg Intramuscular PRN Eliseo Gum B, MD      . magnesium hydroxide (MILK OF MAGNESIA) suspension 30 mL  30 mL Oral Daily PRN Antonieta Pert, MD      . OLANZapine zydis (ZYPREXA) disintegrating tablet 10 mg  10 mg Oral Daily Eliseo Gum B, MD   10 mg at 01/11/21 0752  . OLANZapine zydis (ZYPREXA) disintegrating tablet 15 mg  15 mg Oral QHS Antonieta Pert, MD   15 mg at 01/10/21 2105  . traZODone (DESYREL) tablet 50 mg  50 mg Oral QHS PRN Antonieta Pert, MD   50 mg at 01/07/21 2135   PTA Medications: No medications prior to admission.    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   Physician Treatment Plan for Primary Diagnosis: Psychosis (HCC) Long Term Goal(s): Improvement in symptoms so as ready for discharge Improvement in symptoms so as ready for discharge   Short Term Goals: Ability to identify changes in lifestyle to reduce recurrence of condition will improve Ability to verbalize feelings will improve Ability to demonstrate self-control will improve Ability to identify triggers associated with substance abuse/mental health issues will improve Ability to identify changes in lifestyle to reduce recurrence of condition will improve Ability to verbalize feelings will improve Compliance with prescribed medications will improve Ability to identify triggers associated with substance abuse/mental health issues will improve  Medication Management: Evaluate patient's response, side effects, and tolerance of medication regimen.  Therapeutic Interventions: 1 to 1 sessions, Unit Group sessions and Medication administration.  Evaluation of Outcomes: Progressing  Physician Treatment Plan for Secondary Diagnosis: Principal Problem:   Psychosis (HCC) Active Problems:   Cannabis use with psychotic disorder (HCC)   Schizophrenia spectrum disorder with psychotic disorder type not yet determined (HCC)  Long Term Goal(s): Improvement in symptoms so as ready for discharge Improvement in symptoms so as ready for discharge   Short Term Goals: Ability to identify changes in lifestyle to reduce recurrence of condition will improve Ability to verbalize feelings will improve Ability to demonstrate self-control will improve Ability to identify triggers associated with substance abuse/mental health issues will improve Ability to identify  changes in lifestyle to reduce recurrence of condition will improve Ability to verbalize feelings will improve Compliance with prescribed medications will improve Ability to identify triggers associated with substance  abuse/mental health issues will improve     Medication Management: Evaluate patient's response, side effects, and tolerance of medication regimen.  Therapeutic Interventions: 1 to 1 sessions, Unit Group sessions and Medication administration.  Evaluation of Outcomes: Progressing   RN Treatment Plan for Primary Diagnosis: Psychosis (HCC) Long Term Goal(s): Knowledge of disease and therapeutic regimen to maintain health will improve  Short Term Goals: Ability to demonstrate self-control, Ability to participate in decision making will improve and Ability to verbalize feelings will improve  Medication Management: RN will administer medications as ordered by provider, will assess and evaluate patient's response and provide education to patient for prescribed medication. RN will report any adverse and/or side effects to prescribing provider.  Therapeutic Interventions: 1 on 1 counseling sessions, Psychoeducation, Medication administration, Evaluate responses to treatment, Monitor vital signs and CBGs as ordered, Perform/monitor CIWA, COWS, AIMS and Fall Risk screenings as ordered, Perform wound care treatments as ordered.  Evaluation of Outcomes: Progressing   LCSW Treatment Plan for Primary Diagnosis: Psychosis (HCC) Long Term Goal(s): Safe transition to appropriate next level of care at discharge, Engage patient in therapeutic group addressing interpersonal concerns.  Short Term Goals: Engage patient in aftercare planning with referrals and resources, Increase social support and Increase ability to appropriately verbalize feelings  Therapeutic Interventions: Assess for all discharge needs, 1 to 1 time with Social worker, Explore available resources and support systems, Assess for adequacy in community support network, Educate family and significant other(s) on suicide prevention, Complete Psychosocial Assessment, Interpersonal group therapy.  Evaluation of Outcomes: Progressing   Progress  in Treatment: Attending groups: Yes. Participating in groups: Yes. Taking medication as prescribed: Yes. Toleration medication: Yes. Family/Significant other contact made: Yes, individual(s) contacted:  with mother Patient understands diagnosis: No. Discussing patient identified problems/goals with staff: Yes. Medical problems stabilized or resolved: Yes. Denies suicidal/homicidal ideation: Yes. Issues/concerns per patient self-inventory: No. Other: None  New problem(s) identified: No, Describe:  none  New Short Term/Long Term Goal(s): medication stabilization, elimination of SI thoughts, development of comprehensive mental wellness plan.  Patient Goals:  Pt was asleep.  Discharge Plan or Barriers: Patient is to follow up with [facility] for therapy and psychiatry at discharge  Reason for Continuation of Hospitalization: Delusions  Medication stabilization  Estimated Length of Stay: 3-5 days  Attendees: Patient:  01/11/2021   Physician:  01/11/2021   Nursing:  01/11/2021   RN Care Manager: 01/11/2021   Social Worker: Ruthann Cancer, LCSW 01/11/2021   Recreational Therapist:  01/11/2021   Other:  01/11/2021   Other:  01/11/2021   Other: 01/11/2021       Scribe for Treatment Team: Otelia Santee, LCSW 01/11/2021 9:19 AM

## 2021-01-11 NOTE — [facility] Group Notes (Signed)
[facility] LCSW Group Therapy  01/11/2021 1:20 PM  Type of Therapy:  Group Therapy: Self-Criticism vs Self-Compassion  Participation Level:  Did Not Attend  Summary of Progress/Problems: Patient did not attend.  Meredith A Rumbley 01/11/2021, 1:20 PM

## 2021-01-11 NOTE — Progress Notes (Signed)
   01/11/21 0500  Sleep  Number of Hours 7.25

## 2021-01-11 NOTE — Progress Notes (Signed)
[facility] MD Progress Note  01/11/2021 12:26 PM Jesse Bryan  MRN:  [id number] Subjective:  This AM patient requested to be able to sleep more, so her was seen at lunch time. Patient reports that he feels very well rested and is aware that his fiance attempted to call him 2 times this AM. Patient is noticeably calling his fiance "my girl" rather than his wife, which appears more appropriate. Patient also reports being less concerned with the safety of his family. Patient does not mention being anxious about people finding out where he lives and also has been noticeably more comfortable on the unit and sleeping better at night. Patient has also been noted to be less intrusive by staff. Patient denies SI, HI,and AVH. 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 No pertinent past medical history. History reviewed. No pertinent surgical history. Family History: History reviewed. No pertinent family history. Family Psychiatric  History: See H&P Social History:  Social History   Substance and Sexual Activity  Alcohol Use Not Currently   Comment: socially     Social History   Substance and Sexual Activity  Drug Use Not Currently  . Types: Marijuana   Comment: reports no use since [facility] visit    Social History   Socioeconomic History  . Marital status: Single    Spouse name: Not on file  . Number of children: Not on file  . Years of education: Not on file  . Highest education level: Not on file  Occupational History  . Not on file  Tobacco Use  . Smoking status: Never Smoker  . Smokeless tobacco: Never Used  Vaping Use  . Vaping Use: Former  Substance and Sexual Activity  .  Alcohol use: Not Currently    Comment: socially  . Drug use: Not Currently    Types: Marijuana    Comment: reports no use since [facility] visit  . Sexual activity: Yes  Other Topics Concern  . Not on file  Social History Narrative  . Not on file   Social Determinants of Health   Financial Resource Strain: Not on file  Food Insecurity: Not on file  Transportation Needs: Not on file  Physical Activity: Not on file  Stress: Not on file  Social Connections: Not on file   Additional Social History:                         Sleep: Fair maybe sleeping too much now  Appetite:  Good  Current Medications: Current Facility-Administered Medications  Medication Dose Route Frequency Provider Last Rate Last Admin  . acetaminophen (TYLENOL) tablet 650 mg  650 mg Oral Q6H PRN Antonieta Pert, MD      . alum & mag hydroxide-simeth (MAALOX/MYLANTA) 200-200-20 MG/5ML suspension 30 mL  30 mL Oral Q4H PRN Antonieta Pert, MD      . hydrOXYzine (ATARAX/VISTARIL) tablet 25 mg  25 mg Oral Q6H PRN Bobbye Morton, MD   25 mg at 01/09/21 0741  . OLANZapine zydis (ZYPREXA) disintegrating tablet 10 mg  10 mg Oral Q8H PRN Eliseo Gum B, MD   10 mg at 01/10/21 1012   And  . LORazepam (ATIVAN) tablet 1  mg  1 mg Oral PRN Eliseo Gum B, MD       And  . ziprasidone (GEODON) injection 20 mg  20 mg Intramuscular PRN Eliseo Gum B, MD      . magnesium hydroxide (MILK OF MAGNESIA) suspension 30 mL  30 mL Oral Daily PRN Antonieta Pert, MD      . OLANZapine zydis (ZYPREXA) disintegrating tablet 10 mg  10 mg Oral Daily Eliseo Gum B, MD   10 mg at 01/11/21 0752  . OLANZapine zydis (ZYPREXA) disintegrating tablet 15 mg  15 mg Oral QHS Antonieta Pert, MD   15 mg at 01/10/21 2105  . traZODone (DESYREL) tablet 50 mg  50 mg Oral QHS PRN Antonieta Pert, MD   50 mg at 01/07/21 2135    Lab Results: No results found for this or any previous visit (from the past 48 hour(s)).  Blood Alcohol  level:  Lab Results  Component Value Date   ETH <10 12/11/2020    Metabolic Disorder Labs: Lab Results  Component Value Date   HGBA1C 5.5 12/30/2020   MPG 111.15 12/30/2020   No results found for: PROLACTIN Lab Results  Component Value Date   CHOL 214 (H) 12/30/2020   TRIG 80 12/30/2020   HDL 47 12/30/2020   CHOLHDL 4.6 12/30/2020   VLDL 16 12/30/2020   LDLCALC 151 (H) 12/30/2020    Physical Findings: AIMS: Facial and Oral Movements Muscles of Facial Expression: None, normal Lips and Perioral Area: None, normal Jaw: None, normal Tongue: None, normal,Extremity Movements Upper (arms, wrists, hands, fingers): None, normal Lower (legs, knees, ankles, toes): None, normal, Trunk Movements Neck, shoulders, hips: None, normal, Overall Severity Severity of abnormal movements (highest score from questions above): None, normal Incapacitation due to abnormal movements: None, normal Patient's awareness of abnormal movements (rate only patient's report): No Awareness, Dental Status Current problems with teeth and/or dentures?: No Does patient usually wear dentures?: No  CIWA:    COWS:     Musculoskeletal: Strength & Muscle Tone: within normal limits Gait & Station: normal Patient leans: N/A  Psychiatric Specialty Exam: Physical Exam Constitutional:      Appearance: Normal appearance.  HENT:     Head: Normocephalic and atraumatic.  Pulmonary:     Effort: Pulmonary effort is normal.  Neurological:     Mental Status: He is alert.     Review of Systems  Cardiovascular: Negative for chest pain.  Gastrointestinal: Negative for abdominal pain.  Neurological: Negative for headaches.    Blood pressure 110/67, pulse 69, temperature 97.9 F (36.6 C), temperature source Oral, resp. rate 18, height 5\' 9"  (1.753 m), weight 59 kg, SpO2 100 %.Body mass index is 19.2 kg/m.  General Appearance: Fairly Groomed  Eye Contact:  Good  Speech:  Clear and Coherent  Volume:  Normal   Mood:  Anxious  Affect:  Appropriate  Thought Process:  Linear  Orientation:  Full (Time, Place, and Person)  Thought Content:  Rumination  Suicidal Thoughts:  No  Homicidal Thoughts:  No  Memory:  Recent;   Good  Judgement:  Other:  Improving]  Insight:  Improving  Psychomotor Activity:  Normal  Concentration:  Concentration: Fair  Recall:  NA  Fund of Knowledge:  Good  Language:  Good  Akathisia:  No  Handed:  Right  AIMS (if indicated):     Assets:  Communication Skills Desire for Improvement Housing Intimacy Leisure Time Physical Health Resilience Social Support  ADL's:  Intact  Cognition:  WNL  Sleep:  Number of Hours: 7.25     Treatment Plan Summary: Daily contact with patient to assess and evaluate symptoms and progress in treatment Mr. Karasik is a 25 yo patient who prior to 2022 had no known psychiatric hx. Patient continues to improve and is sleeping better although he slept most of the morning and may have been oversedated with the 10mg  Zyprexa. Will decrease daytime dose and monitor patient again. Patient appears to be less paranoid and grandiose in his speech. Patient thought process is linear at this point. 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 not yet determine Cannabis use disorder:  - Start Zyprexa 5mg  daily and 15mg  QHS Agitation Protocol: Zyprexa 10mg  q8h, Ativan 1mg , Geodon 20mg  IM - Ativan 2mg  q4hPRN for agitation  PRN -Tylenol 650mg  q6hPRN, pain -Maalox 66ml q4h, indigestion -Atarax 25mg PRNTID, anxiety -Milk of Mag 67mL,PRNconstipation -Trazodone 50mg  QHSPRN, insomnia  PGY-1 , MD 01/11/2021, 12:26 PM

## 2021-01-11 NOTE — Progress Notes (Signed)
   01/11/21 1500  Psych Admission Type (Psych Patients Only)  Admission Status Involuntary  Psychosocial Assessment  Patient Complaints Anxiety  Eye Contact Suspiciousness;Watchful  Facial Expression Animated;Anxious;Pensive  Affect Anxious;Preoccupied  Speech Pressured;Tangential  Interaction Assertive;Intrusive;Needy  Motor Activity Fidgety;Pacing;Restless  Appearance/Hygiene Unremarkable  Behavior Characteristics Cooperative  Mood Preoccupied  Thought Chartered certified accountant of ideas;Tangential  Content Delusions;Preoccupation;Paranoia  Delusions Grandeur;Paranoid  Perception Derealization  Hallucination None reported or observed  Judgment Poor  Confusion None  Danger to Self  Current suicidal ideation? Denies  Danger to Others  Danger to Others None reported or observed

## 2021-01-11 NOTE — Progress Notes (Signed)
Pt continues to improve with his behaviors needing less redirection.    01/11/21 2100  Psych Admission Type (Psych Patients Only)  Admission Status Involuntary  Psychosocial Assessment  Patient Complaints Anxiety  Eye Contact Suspiciousness;Watchful  Facial Expression Animated;Anxious;Pensive  Affect Anxious;Preoccupied  Speech Pressured;Tangential  Interaction Assertive;Intrusive;Needy  Motor Activity Fidgety;Pacing;Restless  Appearance/Hygiene Unremarkable  Behavior Characteristics Cooperative  Mood Suspicious;Preoccupied;Pleasant  Thought Chartered certified accountant of ideas;Tangential  Content Delusions;Preoccupation;Paranoia  Delusions Grandeur;Paranoid  Perception Derealization  Hallucination None reported or observed  Judgment Poor  Confusion None  Danger to Self  Current suicidal ideation? Denies  Danger to Others  Danger to Others None reported or observed

## 2021-01-12 NOTE — Progress Notes (Signed)
   01/12/21 2100  COVID-19 Daily Checkoff  Have you had a fever (temp > 37.80C/100F)  in the past 24 hours?  No  If you have had runny nose, nasal congestion, sneezing in the past 24 hours, has it worsened? No  COVID-19 EXPOSURE  Have you traveled outside the state in the past 14 days? No  Have you been in contact with someone with a confirmed diagnosis of COVID-19 or PUI in the past 14 days without wearing appropriate PPE? No  Have you been living in the same home as a person with confirmed diagnosis of COVID-19 or a PUI (household contact)? No  Have you been diagnosed with COVID-19? No

## 2021-01-12 NOTE — [facility] Group Notes (Signed)
LCSW Group Therapy Note  01/12/2021 2:48 PM  Type of Therapy/Topic:  Group Therapy:  Feelings about Diagnosis  Participation Level:  Active   Description of Group:   This group will allow patients to explore their thoughts and feelings about diagnoses they have received. Patients will be guided to explore their level of understanding and acceptance of these diagnoses. Facilitator will encourage patients to process their thoughts and feelings about the reactions of others to their diagnosis and will guide patients in identifying ways to discuss their diagnosis with significant others in their lives. This group will be process-oriented, with patients participating in exploration of their own experiences, giving and receiving support, and processing challenge from other group members.   Therapeutic Goals: 1. Patient will demonstrate understanding of diagnosis as evidenced by identifying two or more symptoms of the disorder 2. Patient will be able to express two feelings regarding the diagnosis 3. Patient will demonstrate their ability to communicate their needs through discussion and/or role play  Summary of Patient Progress: Patient was present for the entirety of the group session. Patient was an active listener and participated in the topic of discussion, provided helpful advice to others, and added nuance to topic of conversation. Patient shared often about feeling paranoid about who he discusses his mental health with. Patient shared that he feels safe sharing with mental health providers, though feels like he is constantly being judged by others in his neighborhood. CSW explored patient's sentiments, and discussed why it could be beneficial to have SOME people who he feels able to open up to. Patient was agreeable and showed insight.    Therapeutic Modalities:   Cognitive Behavioral Therapy Brief Therapy Feelings Identification   Gwenevere Ghazi, MSW, [place], Bridget Hartshorn 01/12/2021 2:48 PM

## 2021-01-12 NOTE — Progress Notes (Signed)
   01/12/21 0500  Sleep  Number of Hours 7.5   

## 2021-01-12 NOTE — Progress Notes (Signed)
[facility] MD Progress Note  01/12/2021 11:59 AM Jesse Bryan  MRN:  [id number] Subjective: Patient is a 25 year old male admitted on 01/01/2021 secondary to psychosis.  Objective: Patient is seen and examined.  Patient is a 25 year old male with the above-stated past psychiatric history who is seen in follow-up.  He is doing much better since the switch to Zyprexa.  We had reduced his daytime dose to 5 mg p.o. daily and continued 15 mg p.o. nightly.  He is tolerated that well.  He still has some mildly elevated mood, and still some degree of paranoia.  He talks about the house where his girlfriend lives which only has "1 camera" and it to watch people around the house, and he wants to put more cameras in.  He is not is elevated previously but his girlfriend.  Previously he would talk incessantly about "my wife".  This was his girlfriend.  His sleep has improved, his appetite is better, and overall he has significantly improved over the last several days.  Review of the nursing notes revealed that he appeared to be improved with his behaviors, and needed less redirection.  He continues to ask for discharge, but is much less intrusive about it.  His vital signs are stable, he is afebrile.  Pulse oximetry on room air is 100%.  He slept 7.75 hours last night.  His potassium on admission was slightly low at 3.3.  We will need to recheck that.  The rest of his electrolytes on admission were normal.  His total cholesterol is elevated at 214.  Triglycerides are mildly elevated at 151.  CBC was essentially normal on admission.  Differential was normal.  Hemoglobin A1c is 5.5.  TSH was 1.805.  Respiratory panel was negative.  Drug screen was positive for marijuana.  EKG showed a sinus rhythm with a normal QTc interval.  Principal Problem: Psychosis (HCC) Diagnosis: Principal Problem:   Psychosis (HCC) Active Problems:   Cannabis use with psychotic disorder (HCC)   Schizophrenia spectrum disorder with psychotic  disorder type not yet determined (HCC)  Total Time spent with patient: 20 minutes  Past Psychiatric History: See admission H&P  Past Medical History: History reviewed. No pertinent past medical history. History reviewed. No pertinent surgical history. Family History: History reviewed. No pertinent family history. Family Psychiatric  History: See admission H&P Social History:  Social History   Substance and Sexual Activity  Alcohol Use Not Currently   Comment: socially     Social History   Substance and Sexual Activity  Drug Use Not Currently  . Types: Marijuana   Comment: reports no use since [facility] visit    Social History   Socioeconomic History  . Marital status: Single    Spouse name: Not on file  . Number of children: Not on file  . Years of education: Not on file  . Highest education level: Not on file  Occupational History  . Not on file  Tobacco Use  . Smoking status: Never Smoker  . Smokeless tobacco: Never Used  Vaping Use  . Vaping Use: Former  Substance and Sexual Activity  . Alcohol use: Not Currently    Comment: socially  . Drug use: Not Currently    Types: Marijuana    Comment: reports no use since [facility] visit  . Sexual activity: Yes  Other Topics Concern  . Not on file  Social History Narrative  . Not on file   Social Determinants of Health   Financial Resource Strain: Not  on file  Food Insecurity: Not on file  Transportation Needs: Not on file  Physical Activity: Not on file  Stress: Not on file  Social Connections: Not on file   Additional Social History:                         Sleep: Good  Appetite:  Good  Current Medications: Current Facility-Administered Medications  Medication Dose Route Frequency Provider Last Rate Last Admin  . acetaminophen (TYLENOL) tablet 650 mg  650 mg Oral Q6H PRN Antonieta Pert, MD      . alum & mag hydroxide-simeth (MAALOX/MYLANTA) 200-200-20 MG/5ML suspension 30 mL  30 mL Oral Q4H PRN  Antonieta Pert, MD      . hydrOXYzine (ATARAX/VISTARIL) tablet 25 mg  25 mg Oral Q6H PRN Bobbye Morton, MD   25 mg at 01/09/21 0741  . OLANZapine zydis (ZYPREXA) disintegrating tablet 10 mg  10 mg Oral Q8H PRN Eliseo Gum B, MD   10 mg at 01/10/21 1012   And  . LORazepam (ATIVAN) tablet 1 mg  1 mg Oral PRN Eliseo Gum B, MD       And  . ziprasidone (GEODON) injection 20 mg  20 mg Intramuscular PRN Eliseo Gum B, MD      . magnesium hydroxide (MILK OF MAGNESIA) suspension 30 mL  30 mL Oral Daily PRN Antonieta Pert, MD      . OLANZapine zydis (ZYPREXA) disintegrating tablet 10 mg  10 mg Oral Daily Eliseo Gum B, MD   10 mg at 01/12/21 0815  . OLANZapine zydis (ZYPREXA) disintegrating tablet 15 mg  15 mg Oral QHS Antonieta Pert, MD   15 mg at 01/11/21 2050  . traZODone (DESYREL) tablet 50 mg  50 mg Oral QHS PRN Antonieta Pert, MD   50 mg at 01/07/21 2135    Lab Results: No results found for this or any previous visit (from the past 48 hour(s)).  Blood Alcohol level:  Lab Results  Component Value Date   ETH <10 12/11/2020    Metabolic Disorder Labs: Lab Results  Component Value Date   HGBA1C 5.5 12/30/2020   MPG 111.15 12/30/2020   No results found for: PROLACTIN Lab Results  Component Value Date   CHOL 214 (H) 12/30/2020   TRIG 80 12/30/2020   HDL 47 12/30/2020   CHOLHDL 4.6 12/30/2020   VLDL 16 12/30/2020   LDLCALC 151 (H) 12/30/2020    Physical Findings: AIMS: Facial and Oral Movements Muscles of Facial Expression: None, normal Lips and Perioral Area: None, normal Jaw: None, normal Tongue: None, normal,Extremity Movements Upper (arms, wrists, hands, fingers): None, normal Lower (legs, knees, ankles, toes): None, normal, Trunk Movements Neck, shoulders, hips: None, normal, Overall Severity Severity of abnormal movements (highest score from questions above): None, normal Incapacitation due to abnormal movements: None, normal Patient's awareness  of abnormal movements (rate only patient's report): No Awareness, Dental Status Current problems with teeth and/or dentures?: No Does patient usually wear dentures?: No  CIWA:    COWS:     Musculoskeletal: Strength & Muscle Tone: within normal limits Gait & Station: normal Patient leans: N/A  Psychiatric Specialty Exam: Physical Exam Vitals and nursing note reviewed.  Constitutional:      Appearance: Normal appearance.  HENT:     Head: Normocephalic and atraumatic.  Pulmonary:     Effort: Pulmonary effort is normal.  Neurological:     General: No focal deficit  present.     Mental Status: He is alert and oriented to person, place, and time.     Review of Systems  Blood pressure 118/64, pulse 66, temperature 97.7 F (36.5 C), temperature source Oral, resp. rate 18, height 5\' 9"  (1.753 m), weight 59 kg, SpO2 100 %.Body mass index is 19.2 kg/m.  General Appearance: Casual  Eye Contact:  Good  Speech:  Normal Rate  Volume:  Normal  Mood:  Euthymic  Affect:  Congruent  Thought Process:  Coherent and Descriptions of Associations: Intact  Orientation:  Full (Time, Place, and Person)  Thought Content:  Logical  Suicidal Thoughts:  No  Homicidal Thoughts:  No  Memory:  Immediate;   Fair Recent;   Fair Remote;   Fair  Judgement:  Intact  Insight:  Fair  Psychomotor Activity:  Increased  Concentration:  Concentration: Fair and Attention Span: Fair  Recall:  of Knowledge:  Fair  Language:  Good  Akathisia:  Negative  Handed:  Right  AIMS (if indicated):     Assets:  Desire for Improvement Housing Resilience Social Support  ADL's:  Intact  Cognition:  WNL  Sleep:  Number of Hours: 7.5     Treatment Plan Summary: Daily contact with patient to assess and evaluate symptoms and progress in treatment, Medication management and Plan : Patient is seen and examined.  Patient is a 25 year old male with the above-stated past psychiatric history who is seen in  follow-up.  Diagnosis: 1.  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  Cannabis use disorder  Pertinent findings on examination today: 1.  Delusional thinking of a grandiose and paranoid nature has decreased. 2.  Sleep has improved. 3.  No evidence of suicidal or homicidal ideation. 4.  Disorganization has significantly improved.  Plan: 1.  Continue hydroxyzine 25 mg p.o. every 6 hours as needed anxiety. 2.  Continue Zyprexa 10 mg p.o. daily and 15 mg p.o. nightly. 3.  Continue trazodone 50 mg p.o. nightly as needed insomnia. 4.  Recheck potassium. 5.  Disposition planning-if patient continues to improve we can anticipate discharge in 1 to 2 days. 32, MD 01/12/2021, 11:59 AM

## 2021-01-12 NOTE — Progress Notes (Signed)
   01/12/21 1000  Psych Admission Type (Psych Patients Only)  Admission Status Involuntary  Psychosocial Assessment  Patient Complaints None  Eye Contact Suspiciousness;Watchful  Facial Expression Animated;Anxious;Pensive  Affect Anxious;Preoccupied  Speech Pressured;Tangential  Interaction Assertive;Intrusive;Needy  Motor Activity Fidgety;Pacing;Restless  Appearance/Hygiene Unremarkable  Behavior Characteristics Cooperative  Mood Pleasant  Thought Chartered certified accountant of ideas;Tangential  Content Delusions;Preoccupation;Paranoia  Delusions Grandeur;Paranoid  Perception Derealization  Hallucination None reported or observed  Judgment Poor  Confusion None  Danger to Self  Current suicidal ideation? Denies  Danger to Others  Danger to Others None reported or observed

## 2021-01-12 NOTE — Progress Notes (Signed)
   01/12/21 2100  Psychosocial Assessment  Patient Complaints None  Eye Contact Brief  Facial Expression Animated  Affect Anxious  Speech Logical/coherent  Interaction Assertive  Motor Activity Fidgety  Appearance/Hygiene Unremarkable  Behavior Characteristics Appropriate to situation;Cooperative  Mood Pleasant  Thought Process  Coherency WDL  Content WDL  Delusions None reported or observed  Perception WDL  Hallucination None reported or observed  Judgment WDL  Confusion None  Danger to Self  Current suicidal ideation? Denies  Danger to Others  Danger to Others None reported or observed

## 2021-01-13 MED ORDER — OLANZAPINE 10 MG PO TBDP: 10.0000 mg | ORAL_TABLET | Freq: Every day | ORAL | 0 refills | Status: DC

## 2021-01-13 MED ORDER — OLANZAPINE 15 MG PO TBDP: 15.0000 mg | ORAL_TABLET | Freq: Every day | ORAL | 0 refills | Status: DC

## 2021-01-13 MED ORDER — HYDROXYZINE HCL 25 MG PO TABS: 25.0000 mg | ORAL_TABLET | Freq: Four times a day (QID) | ORAL | 0 refills | Status: DC | PRN

## 2021-01-13 NOTE — Plan of Care (Signed)
Discharge note  Patient verbalizes readiness for discharge. Follow up plan explained, AVS, Transition record and SRA given. Prescriptions and teaching provided. Belongings returned and signed for. Suicide safety plan completed and signed. Patient verbalizes understanding. Patient denies SI/HI and assures this Clinical research associate they will seek assistance should that change. Patient discharged to lobby to wait for mother.  Problem: Education: Goal: Knowledge of [facility] Education information/materials will improve Outcome: Adequate for Discharge Goal: Emotional status will improve Outcome: Adequate for Discharge Goal: Mental status will improve Outcome: Adequate for Discharge Goal: Verbalization of understanding the information provided will improve Outcome: Adequate for Discharge   Problem: Coping: Goal: Coping ability will improve Outcome: Adequate for Discharge Goal: Will verbalize feelings Outcome: Adequate for Discharge   Problem: Health Behavior/Discharge Planning: Goal: Compliance with prescribed medication regimen will improve Outcome: Adequate for Discharge   Problem: Nutritional: Goal: Ability to achieve adequate nutritional intake will improve Outcome: Adequate for Discharge   Problem: Role Relationship: Goal: Ability to communicate needs accurately will improve Outcome: Adequate for Discharge Goal: Ability to interact with others will improve Outcome: Adequate for Discharge   Problem: Safety: Goal: Ability to redirect hostility and anger into socially appropriate behaviors will improve Outcome: Adequate for Discharge Goal: Ability to remain free from injury will improve 01/13/2021 1116 by Raylene Miyamoto, RN Outcome: Adequate for Discharge 01/13/2021 0734 by Raylene Miyamoto, RN Outcome: Progressing   Problem: Self-Care: Goal: Ability to participate in self-care as condition permits will improve 01/13/2021 1116 by Raylene Miyamoto, RN Outcome: Adequate for  Discharge 01/13/2021 0734 by Raylene Miyamoto, RN Outcome: Progressing   Problem: Self-Concept: Goal: Will verbalize positive feelings about self 01/13/2021 1116 by Raylene Miyamoto, RN Outcome: Adequate for Discharge 01/13/2021 0734 by Raylene Miyamoto, RN Outcome: Progressing

## 2021-01-13 NOTE — Progress Notes (Signed)
  [facility] Adult Case Management Discharge Plan :  Will you be returning to the same living situation after discharge:  Yes,  returning home with family. At discharge, do you have transportation home?: Yes,  patient has transportation to get home. Do you have the ability to pay for your medications: Yes,  patient has means to get medications.  Release of information consent forms completed and in the chart;  Patient's signature needed at discharge.  Patient to Follow up at:  Follow-up Information    [facility]. Go on 01/14/2021.   Specialty: Behavioral Health Why: You have a walk in appointment for therapy services on 01/21/21 at 7:45 am.   You also have a walk in appointment for medication management services on 01/28/21 at 7:45 am. Walk in appointments are first come, first served and are held in person. Contact information: [phone] [place] [address] [phone]              Next level of care provider has access to [facility] Link:yes  Safety Planning and Suicide Prevention discussed: Yes,  SPE completed.     Has patient been referred to the Quitline?: N/A patient is not a smoker  Patient has been referred for addiction treatment: N/A  Evorn Gong, LCSW 01/13/2021, 11:40 AM
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
## 2021-01-13 NOTE — BHH Suicide Risk Assessment (Signed)
[facility] Discharge Suicide Risk Assessment   Principal Problem: Psychosis [facility]) Discharge Diagnoses: Principal Problem:   Psychosis (HCC) Active Problems:   Cannabis use with psychotic disorder (HCC)   Schizophrenia spectrum disorder with psychotic disorder type not yet determined (HCC)   Total Time spent with patient: 20 minutes  Musculoskeletal: Strength & Muscle Tone: within normal limits Gait & Station: normal Patient leans: N/A  Psychiatric Specialty Exam: Review of Systems  All other systems reviewed and are negative.   Blood pressure 125/77, pulse 77, temperature 97.6 F (36.4 C), temperature source Oral, resp. rate 18, height 5\' 9"  (1.753 m), weight 59 kg, SpO2 100 %.Body mass index is 19.2 kg/m.  General Appearance: Casual  Eye Contact::  Good  Speech:  Normal Rate409  Volume:  Normal  Mood:  Euthymic  Affect:  Congruent  Thought Process:  Coherent and Descriptions of Associations: Intact  Orientation:  Full (Time, Place, and Person)  Thought Content:  Delusions and Paranoid Ideation  Suicidal Thoughts:  No  Homicidal Thoughts:  No  Memory:  Immediate;   Good Recent;   Good Remote;   Good  Judgement:  Intact  Insight:  Fair  Psychomotor Activity:  Normal  Concentration:  Fair  Recall:  Fair  Fund of Knowledge:Good  Language: Good  Akathisia:  Negative  Handed:  Right  AIMS (if indicated):     Assets:  Desire for Improvement Housing Resilience Social Support  Sleep:  Number of Hours: 6.75  Cognition: WNL  ADL's:  Intact   Mental Status Per Nursing Assessment::   On Admission:  NA  Demographic Factors:  Male, Low socioeconomic status and Unemployed  Loss Factors: Financial problems/change in socioeconomic status  Historical Factors: Impulsivity  Risk Reduction Factors:   Living with another person, especially a relative and Positive social support  Continued Clinical Symptoms:  Alcohol/Substance Abuse/Dependencies Schizophrenia:   Less than  45 years old Paranoid or undifferentiated type  Cognitive Features That Contribute To Risk:  None    Suicide Risk:  Minimal: No identifiable suicidal ideation.  Patients presenting with no risk factors but with morbid ruminations; may be classified as minimal risk based on the severity of the depressive symptoms   Follow-up Information    [facility]. Go on 01/14/2021.   Specialty: Behavioral Health Why: You have a walk in appointment for therapy services on 01/21/21 at 7:45 am.   You also have a walk in appointment for medication management services on 01/28/21 at 7:45 am. Walk in appointments are first come, first served and are held in person. Contact information: [phone] [place] [phone]              Plan Of Care/Follow-up recommendations:  Activity:  ad lib  [phone], MD 01/13/2021, 9:22 AM

## 2021-01-13 NOTE — Plan of Care (Signed)
  Problem: Safety: Goal: Ability to remain free from injury will improve Outcome: Progressing   Problem: Self-Care: Goal: Ability to participate in self-care as condition permits will improve Outcome: Progressing   Problem: Self-Concept: Goal: Will verbalize positive feelings about self Outcome: Progressing   

## 2021-01-21 ENCOUNTER — Ambulatory Visit (INDEPENDENT_AMBULATORY_CARE_PROVIDER_SITE_OTHER): Payer: No Payment, Other | Admitting: Licensed Clinical Social Worker

## 2021-01-21 ENCOUNTER — Other Ambulatory Visit: Payer: Self-pay

## 2021-01-21 DIAGNOSIS — F19959 Other psychoactive substance use, unspecified with psychoactive substance-induced psychotic disorder, unspecified: Secondary | ICD-10-CM | POA: Diagnosis not present

## 2021-01-21 DIAGNOSIS — F320 Major depressive disorder, single episode, mild: Secondary | ICD-10-CM | POA: Insufficient documentation
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
## 2021-01-22 ENCOUNTER — Telehealth (HOSPITAL_COMMUNITY): Payer: Self-pay | Admitting: *Deleted

## 2021-01-22 NOTE — Telephone Encounter (Signed)
Jesse Bryan called on his behalf stating he is having difficulty with his medicine, which per chart is Zyprexa. She states he is very tired and has poor concentration. She doesn't feel he is doing well and his scheduled appt is on 3/23 with Ilda Foil NP, which will be his first time seeing her. He was recently in the [facility] in Feb 2022. Transferred her to the front desk to discuss with them a walk in time or a sooner appt with Brittney.

## 2021-01-24 ENCOUNTER — Ambulatory Visit (INDEPENDENT_AMBULATORY_CARE_PROVIDER_SITE_OTHER): Payer: No Payment, Other | Admitting: Psychiatry

## 2021-01-24 ENCOUNTER — Encounter (HOSPITAL_COMMUNITY): Payer: Self-pay | Admitting: Psychiatry

## 2021-01-24 ENCOUNTER — Other Ambulatory Visit: Payer: Self-pay

## 2021-01-24 VITALS — BP 114/52 | HR 64 | Temp 98.2°F | Ht 71.0 in | Wt 147.2 lb

## 2021-01-24 DIAGNOSIS — F23 Brief psychotic disorder: Secondary | ICD-10-CM | POA: Diagnosis not present

## 2021-01-24 MED ORDER — OLANZAPINE 15 MG PO TBDP: 15.0000 mg | ORAL_TABLET | Freq: Every day | ORAL | 2 refills | Status: AC
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
## 2021-01-28 ENCOUNTER — Ambulatory Visit (HOSPITAL_COMMUNITY): Payer: Federal, State, Local not specified - Other | Admitting: Behavioral Health

## 2021-02-06 ENCOUNTER — Encounter (HOSPITAL_COMMUNITY): Payer: Federal, State, Local not specified - Other | Admitting: Psychiatry

## 2021-03-14 ENCOUNTER — Other Ambulatory Visit (HOSPITAL_COMMUNITY): Payer: Self-pay

## 2021-04-18 ENCOUNTER — Other Ambulatory Visit: Payer: Self-pay

## 2021-04-18 ENCOUNTER — Telehealth (INDEPENDENT_AMBULATORY_CARE_PROVIDER_SITE_OTHER): Payer: No Payment, Other | Admitting: Psychiatry

## 2021-04-18 ENCOUNTER — Encounter (HOSPITAL_COMMUNITY): Payer: Self-pay | Admitting: Psychiatry

## 2021-04-18 DIAGNOSIS — F1911 Other psychoactive substance abuse, in remission: Secondary | ICD-10-CM

## 2021-04-18 NOTE — Progress Notes (Signed)
BH MD/PA/NP OP Progress Note Virtual Visit via Video Note  I connected with Jesse Bryan on 04/18/21 at 10:30 AM EDT by a video enabled telemedicine application and verified that I am speaking with the correct person using two identifiers.  Location: Patient: Home Provider: Clinic   I discussed the limitations of evaluation and management by telemedicine and the availability of in person appointments. The patient expressed understanding and agreed to proceed.  I provided 30 minutes of non-face-to-face time during this encounter.    04/18/2021 10:50 AM Jesse Bryan  MRN:  [id number]  Chief Complaint: I have stopped taking medications HPI: 25 year old male seen today for follow up psychiatric evaluation. 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 He is currently managed on Zyprexa 15 mg nightly. He notes that he discontinued his medications over two months ago and is feeling well.  Today he is pleasant, cooperative, engaged in conversation, and maintained eye contact. 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 He denies anxiety, depression, SI/HI/VAH, mania, or paranoia. He scored a 0 on his GAD 7, at his last visit he scored a 0. Provider also conducted a PHQ 9 and patient scored a 0, at his last visit he scored a 6. He endorses adequate sleep and appetite.  Today patient notes that he does not want to restart medications. He will follow up with provider as needed. No other concerns noted at this time.  Visit Diagnosis:    ICD-10-CM   1. 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  Past Medical History: No past medical history on file. No past surgical history on file.  Family Psychiatric History: Mother alcohol use  Family  History: No family history on file.  Social History:  Social History   Socioeconomic History  . Marital status: Single    Spouse name: Not on file  . Number of children: Not on file  . Years of education: Not on file  . Highest education level: Not on file  Occupational History  . Not on file  Tobacco Use  . Smoking status: Never Smoker  . Smokeless tobacco: Never Used  Vaping Use  . Vaping Use: Former  Substance and Sexual Activity  . Alcohol use: Not Currently    Comment: socially  . Drug use: Not Currently    Types: Marijuana    Comment: reports no use since [facility] visit  . Sexual activity: Yes  Other Topics Concern  . Not on file  Social History Narrative  . Not on file   Social Determinants of Health   Financial Resource Strain: Low Risk   . Difficulty of Paying Living Expenses: Not hard at all  Food Insecurity: No Food Insecurity  . Worried About Programme researcher, broadcasting/film/video in the Last Year: Never true  . Ran Out of Food in the Last Year: Never true  Transportation Needs: No Transportation Needs  . Lack of Transportation (Medical): No  . Lack of Transportation (Non-Medical): No  Physical Activity: Sufficiently Active  . Days of Exercise per Week: 4 days  . Minutes of Exercise per Session: 60 min  Stress: No Stress Concern Present  . Feeling of Stress : Not at all  Social Connections: Moderately Isolated  . Frequency of Communication with Friends and Family: More than three times a week  .  Frequency of Social Gatherings with Friends and Family: Twice a week  . Attends Religious Services: Never  . Active Member of Clubs or Organizations: No  . Attends Banker Meetings: Never  . Marital Status: Living with partner    Allergies:  Allergies  Allergen Reactions  . Penicillins Hives    Metabolic Disorder Labs: Lab Results  Component Value Date   HGBA1C 5.5 12/30/2020   MPG 111.15 12/30/2020   No results found for: PROLACTIN Lab Results  Component  Value Date   CHOL 214 (H) 12/30/2020   TRIG 80 12/30/2020   HDL 47 12/30/2020   CHOLHDL 4.6 12/30/2020   VLDL 16 12/30/2020   LDLCALC 151 (H) 12/30/2020   Lab Results  Component Value Date   TSH 1.805 12/30/2020    Therapeutic Level Labs: No results found for: LITHIUM No results found for: VALPROATE No components found for:  CBMZ  Current Medications: Current Outpatient Medications  Medication Sig Dispense Refill  . olanzapine zydis (ZYPREXA) 15 MG disintegrating tablet Take 1 tablet (15 mg total) by mouth at bedtime. 30 tablet 2   No current facility-administered medications for this visit.     Musculoskeletal: Strength & Muscle Tone: Unable to assess due to telhealth visit Gait & Station: Unable to assess due to telhealth visit Patient leans: N/A  Psychiatric Specialty Exam: Review of Systems  There were no vitals taken for this visit.There is no height or weight on file to calculate BMI.  General Appearance: Well Groomed  Eye Contact:  Good  Speech:  Clear and Coherent and Normal Rate  Volume:  Normal  Mood:  Euthymic  Affect:  Appropriate and Congruent  Thought Process:  Coherent, Goal Directed and Linear  Orientation:  Full (Time, Place, and Person)  Thought Content: WDL and Logical   Suicidal Thoughts:  No  Homicidal Thoughts:  No  Memory:  Immediate;   Good Recent;   Good Remote;   Good  Judgement:  Good  Insight:  Good  Psychomotor Activity:  Normal  Concentration:  Concentration: Good and Attention Span: Good  Recall:  Good  Fund of Knowledge: Good  Language: Good  Akathisia:  No  Handed:  Right  AIMS (if indicated):Not done  Assets:  Communication Skills Desire for Improvement Financial Resources/Insurance Housing Leisure Time Physical Health Social Support  ADL's:  Intact  Cognition: WNL  Sleep:  Good   Screenings: AIMS   Flowsheet Row [facility]  AIMS Total  Score 0    AUDIT   Flowsheet Row [facility]  Alcohol Use Disorder Identification Test Final Score (AUDIT) 0    GAD-7   Flowsheet Row Video Visit from 04/18/2021 in [facility] Clinical Support from 01/24/2021 in [facility]  Total GAD-7 Score 0 0    PHQ2-9   Flowsheet Row Video Visit from 04/18/2021 in [facility] Clinical Support from 01/24/2021 in [facility] Counselor from 01/21/2021 in [facility]  PHQ-2 Total Score 0 0 1  PHQ-9 Total Score 0 6 --    Flowsheet Row Clinical Support from 01/24/2021 in [facility] Counselor from 01/21/2021 in [facility]Discharged[facility]  C-SSRS RISK CATEGORY Error: Q3, 4, or 5 should not be populated when Q2  is No No Risk Error: Q3, 4, or 5 should not be populated when Q2 is No       Assessment and Plan: Patient notes that he discontinued Zyprexa and notes that he does not want to restart medications. He will follow up as needed with provider.   1. Other psychoactive substance abuse, in remission (HCC)    Shanna Cisco, NP 04/18/2021, 10:50 AM
